# Patient Record
Sex: Female | Born: 1970 | Race: Black or African American | Hispanic: No | Marital: Married | Smoking: Former smoker
Health system: Southern US, Community
[De-identification: ages and names within clinical notes are randomized; demographics above are authoritative.]

## PROBLEM LIST (undated history)

## (undated) DIAGNOSIS — D259 Leiomyoma of uterus, unspecified: Secondary | ICD-10-CM

## (undated) DIAGNOSIS — D649 Anemia, unspecified: Secondary | ICD-10-CM

## (undated) DIAGNOSIS — T7840XA Allergy, unspecified, initial encounter: Secondary | ICD-10-CM

## (undated) DIAGNOSIS — R519 Headache, unspecified: Secondary | ICD-10-CM

## (undated) DIAGNOSIS — E119 Type 2 diabetes mellitus without complications: Secondary | ICD-10-CM

## (undated) DIAGNOSIS — K219 Gastro-esophageal reflux disease without esophagitis: Secondary | ICD-10-CM

## (undated) DIAGNOSIS — F32A Depression, unspecified: Secondary | ICD-10-CM

## (undated) HISTORY — DX: Allergy, unspecified, initial encounter: T78.40XA

## (undated) HISTORY — DX: Leiomyoma of uterus, unspecified: D25.9

## (undated) HISTORY — DX: Type 2 diabetes mellitus without complications: E11.9

## (undated) HISTORY — DX: Depression, unspecified: F32.A

## (undated) HISTORY — PX: ABDOMINAL HYSTERECTOMY: SHX81

---

## 2010-09-12 ENCOUNTER — Emergency Department: Payer: Self-pay | Admitting: Emergency Medicine

## 2010-11-11 ENCOUNTER — Ambulatory Visit: Payer: Self-pay | Admitting: Family Medicine

## 2011-05-23 DIAGNOSIS — G43909 Migraine, unspecified, not intractable, without status migrainosus: Secondary | ICD-10-CM | POA: Insufficient documentation

## 2012-11-02 ENCOUNTER — Emergency Department: Payer: Self-pay | Admitting: Emergency Medicine

## 2012-11-02 LAB — CBC
HGB: 13 g/dL (ref 12.0–16.0)
MCH: 26.5 pg (ref 26.0–34.0)
Platelet: 291 10*3/uL (ref 150–440)

## 2012-11-02 LAB — COMPREHENSIVE METABOLIC PANEL
Albumin: 3.5 g/dL (ref 3.4–5.0)
Alkaline Phosphatase: 79 U/L (ref 50–136)
Anion Gap: 3 — ABNORMAL LOW (ref 7–16)
BUN: 12 mg/dL (ref 7–18)
Bilirubin,Total: 0.1 mg/dL — ABNORMAL LOW (ref 0.2–1.0)
Chloride: 108 mmol/L — ABNORMAL HIGH (ref 98–107)
Creatinine: 0.94 mg/dL (ref 0.60–1.30)
Glucose: 80 mg/dL (ref 65–99)
Osmolality: 273 (ref 275–301)
SGOT(AST): 24 U/L (ref 15–37)
SGPT (ALT): 18 U/L (ref 12–78)
Total Protein: 7.9 g/dL (ref 6.4–8.2)

## 2012-11-02 LAB — URINALYSIS, COMPLETE
RBC,UR: 19157 /HPF (ref 0–5)
Squamous Epithelial: NONE SEEN

## 2012-11-02 LAB — LIPASE, BLOOD: Lipase: 205 U/L (ref 73–393)

## 2012-12-14 ENCOUNTER — Emergency Department: Payer: Self-pay | Admitting: Emergency Medicine

## 2012-12-14 LAB — COMPREHENSIVE METABOLIC PANEL
Albumin: 3.1 g/dL — ABNORMAL LOW (ref 3.4–5.0)
BUN: 10 mg/dL (ref 7–18)
Bilirubin,Total: 0.2 mg/dL (ref 0.2–1.0)
Co2: 25 mmol/L (ref 21–32)
Creatinine: 0.93 mg/dL (ref 0.60–1.30)
EGFR (African American): >60
Glucose: 109 mg/dL — ABNORMAL HIGH (ref 65–99)
Osmolality: 273 (ref 275–301)
Sodium: 137 mmol/L (ref 136–145)

## 2012-12-14 LAB — URINALYSIS, COMPLETE
Blood: NEGATIVE
Glucose,UR: NEGATIVE mg/dL (ref 0–75)
Nitrite: NEGATIVE
RBC,UR: 1 /HPF (ref 0–5)
Squamous Epithelial: 2
WBC UR: <1 /HPF (ref 0–5)

## 2012-12-14 LAB — CBC
HCT: 38.7 % (ref 35.0–47.0)
MCV: 79 fL — ABNORMAL LOW (ref 80–100)

## 2013-09-05 ENCOUNTER — Emergency Department: Payer: Self-pay | Admitting: Emergency Medicine

## 2013-09-05 LAB — BASIC METABOLIC PANEL
ANION GAP: 10 (ref 7–16)
BUN: 9 mg/dL (ref 7–18)
Calcium, Total: 8.8 mg/dL (ref 8.5–10.1)
Chloride: 105 mmol/L (ref 98–107)
Co2: 25 mmol/L (ref 21–32)
Creatinine: 1 mg/dL (ref 0.60–1.30)
EGFR (African American): >60
GLUCOSE: 115 mg/dL — AB (ref 65–99)
Osmolality: 279 (ref 275–301)
Potassium: 3.7 mmol/L (ref 3.5–5.1)
Sodium: 140 mmol/L (ref 136–145)

## 2013-09-05 LAB — CBC WITH DIFFERENTIAL/PLATELET
BASOS PCT: 0.8 %
Basophil #: 0.1 10*3/uL (ref 0.0–0.1)
EOS ABS: 0.2 10*3/uL (ref 0.0–0.7)
Eosinophil %: 2.1 %
HCT: 33.7 % — ABNORMAL LOW (ref 35.0–47.0)
HGB: 10.5 g/dL — AB (ref 12.0–16.0)
Lymphocyte #: 2.7 10*3/uL (ref 1.0–3.6)
Lymphocyte %: 29.5 %
MCH: 22.6 pg — ABNORMAL LOW (ref 26.0–34.0)
MCHC: 31 g/dL — AB (ref 32.0–36.0)
MCV: 73 fL — ABNORMAL LOW (ref 80–100)
Monocyte #: 0.7 x10 3/mm (ref 0.2–0.9)
Monocyte %: 8 %
NEUTROS PCT: 59.6 %
Neutrophil #: 5.5 10*3/uL (ref 1.4–6.5)
PLATELETS: 387 10*3/uL (ref 150–440)
RBC: 4.64 10*6/uL (ref 3.80–5.20)
RDW: 18.1 % — ABNORMAL HIGH (ref 11.5–14.5)
WBC: 9.3 10*3/uL (ref 3.6–11.0)

## 2013-09-05 LAB — TROPONIN I

## 2014-03-10 ENCOUNTER — Emergency Department: Payer: Self-pay | Admitting: Emergency Medicine

## 2014-03-12 LAB — BETA STREP CULTURE(ARMC)

## 2014-05-28 ENCOUNTER — Emergency Department
Admit: 2014-05-28 | Disposition: A | Discharge status: Home / Self Care | Payer: Self-pay | Admitting: Emergency Medicine

## 2014-05-28 DIAGNOSIS — F329 Major depressive disorder, single episode, unspecified: Secondary | ICD-10-CM | POA: Insufficient documentation

## 2014-05-28 LAB — BASIC METABOLIC PANEL
Anion Gap: 5 — ABNORMAL LOW (ref 7–16)
BUN: 10 mg/dL
CALCIUM: 8.7 mg/dL — AB
CREATININE: 0.8 mg/dL
Chloride: 107 mmol/L
Co2: 25 mmol/L
EGFR (African American): >60
EGFR (Non-African Amer.): >60
GLUCOSE: 121 mg/dL — AB
Potassium: 3.7 mmol/L
Sodium: 137 mmol/L

## 2014-05-28 LAB — TROPONIN I: Troponin-I: <0.03 ng/mL

## 2014-05-29 LAB — CBC WITH DIFFERENTIAL/PLATELET
BASOS ABS: 0.1 10*3/uL (ref 0.0–0.1)
Basophil %: 1.1 %
EOS ABS: 0.3 10*3/uL (ref 0.0–0.7)
Eosinophil %: 3.7 %
HCT: 27.1 % — ABNORMAL LOW (ref 35.0–47.0)
HGB: 8 g/dL — ABNORMAL LOW (ref 12.0–16.0)
LYMPHS ABS: 2.5 10*3/uL (ref 1.0–3.6)
Lymphocyte %: 31.9 %
MCH: 18.6 pg — AB (ref 26.0–34.0)
MCHC: 29.7 g/dL — ABNORMAL LOW (ref 32.0–36.0)
MCV: 63 fL — ABNORMAL LOW (ref 80–100)
MONO ABS: 0.6 x10 3/mm (ref 0.2–0.9)
Monocyte %: 7.1 %
NEUTROS ABS: 4.5 10*3/uL (ref 1.4–6.5)
Neutrophil %: 56.2 %
Platelet: 327 10*3/uL (ref 150–440)
RBC: 4.33 10*6/uL (ref 3.80–5.20)
RDW: 20 % — AB (ref 11.5–14.5)
WBC: 8 10*3/uL (ref 3.6–11.0)

## 2015-11-06 DIAGNOSIS — O200 Threatened abortion: Secondary | ICD-10-CM | POA: Insufficient documentation

## 2015-11-06 DIAGNOSIS — R112 Nausea with vomiting, unspecified: Secondary | ICD-10-CM | POA: Insufficient documentation

## 2015-11-06 DIAGNOSIS — R102 Pelvic and perineal pain: Secondary | ICD-10-CM | POA: Insufficient documentation

## 2015-11-06 DIAGNOSIS — Z87891 Personal history of nicotine dependence: Secondary | ICD-10-CM | POA: Insufficient documentation

## 2015-11-06 DIAGNOSIS — Z3A00 Weeks of gestation of pregnancy not specified: Secondary | ICD-10-CM | POA: Insufficient documentation

## 2015-11-06 LAB — POCT PREGNANCY, URINE: PREG TEST UR: POSITIVE — AB

## 2015-11-06 NOTE — ED Triage Notes (Signed)
Pt reports to ED w/ lower abd cramping that began today.  Pt denies urinary s/s, last BM today.  Denies CP. SOB, n/v/d, LOC, fever or dizziness.  Pt sts that she had positive preg test at home.

## 2015-11-07 ENCOUNTER — Emergency Department
Admission: EM | Admit: 2015-11-07 | Discharge: 2015-11-07 | Disposition: A | Discharge status: Home / Self Care | Payer: Self-pay | Attending: Emergency Medicine | Admitting: Emergency Medicine

## 2015-11-07 ENCOUNTER — Emergency Department: Payer: Self-pay

## 2015-11-07 DIAGNOSIS — O200 Threatened abortion: Secondary | ICD-10-CM

## 2015-11-07 HISTORY — DX: Anemia, unspecified: D64.9

## 2015-11-07 LAB — COMPREHENSIVE METABOLIC PANEL
ALT: 14 U/L (ref 14–54)
ANION GAP: 7 (ref 5–15)
AST: 15 U/L (ref 15–41)
Albumin: 4 g/dL (ref 3.5–5.0)
Alkaline Phosphatase: 58 U/L (ref 38–126)
BUN: 8 mg/dL (ref 6–20)
CALCIUM: 9.2 mg/dL (ref 8.9–10.3)
CO2: 24 mmol/L (ref 22–32)
Chloride: 106 mmol/L (ref 101–111)
Creatinine, Ser: 0.77 mg/dL (ref 0.44–1.00)
GFR calc Af Amer: >60 mL/min (ref >60)
Glucose, Bld: 113 mg/dL — ABNORMAL HIGH (ref 65–99)
POTASSIUM: 3.6 mmol/L (ref 3.5–5.1)
Sodium: 137 mmol/L (ref 135–145)
TOTAL PROTEIN: 7.8 g/dL (ref 6.5–8.1)
Total Bilirubin: 0.3 mg/dL (ref 0.3–1.2)

## 2015-11-07 LAB — CBC
HCT: 38.6 % (ref 35.0–47.0)
Hemoglobin: 12.4 g/dL (ref 12.0–16.0)
MCH: 24.7 pg — ABNORMAL LOW (ref 26.0–34.0)
MCHC: 32.2 g/dL (ref 32.0–36.0)
MCV: 76.7 fL — AB (ref 80.0–100.0)
PLATELETS: 270 10*3/uL (ref 150–440)
RBC: 5.04 MIL/uL (ref 3.80–5.20)
RDW: 22.1 % — AB (ref 11.5–14.5)
WBC: 10.6 10*3/uL (ref 3.6–11.0)

## 2015-11-07 LAB — URINALYSIS COMPLETE WITH MICROSCOPIC (ARMC ONLY)
BILIRUBIN URINE: NEGATIVE
Bacteria, UA: NONE SEEN
GLUCOSE, UA: NEGATIVE mg/dL
HGB URINE DIPSTICK: NEGATIVE
Ketones, ur: NEGATIVE mg/dL
LEUKOCYTES UA: NEGATIVE
Nitrite: NEGATIVE
Protein, ur: NEGATIVE mg/dL
RBC / HPF: NONE SEEN RBC/hpf (ref 0–5)
Specific Gravity, Urine: 1.01 (ref 1.005–1.030)
pH: 6 (ref 5.0–8.0)

## 2015-11-07 LAB — LIPASE, BLOOD: Lipase: 29 U/L (ref 11–51)

## 2015-11-07 LAB — WET PREP, GENITAL
SPERM: NONE SEEN
TRICH WET PREP: NONE SEEN
YEAST WET PREP: NONE SEEN

## 2015-11-07 LAB — HCG, QUANTITATIVE, PREGNANCY: hCG, Beta Chain, Quant, S: 201 m[IU]/mL — ABNORMAL HIGH (ref <5)

## 2015-11-07 LAB — CHLAMYDIA/NGC RT PCR (ARMC ONLY)
CHLAMYDIA TR: NOT DETECTED
N gonorrhoeae: NOT DETECTED

## 2015-11-07 LAB — ABO/RH: ABO/RH(D): A POS

## 2015-11-07 MED ORDER — ONDANSETRON 4 MG PO TBDP
4.0000 mg | ORAL_TABLET | Freq: Once | ORAL | Status: AC
  Administered 2015-11-07: 4 mg via ORAL
  Filled 2015-11-07: qty 1

## 2015-11-07 MED ORDER — HYDROCODONE-ACETAMINOPHEN 5-325 MG PO TABS
1.0000 | ORAL_TABLET | Freq: Once | ORAL | Status: AC
  Administered 2015-11-07: 1 via ORAL
  Filled 2015-11-07: qty 1

## 2015-11-07 NOTE — ED Notes (Signed)
Pt c/o lower abdominal pain that began this morning. Pt denying any spotting. Pt stating this is her 5th pregnancy. Pt has not had any discharge that she has noted.  Dr. Beather Arbour at bedside. Pelvic exam conducted. Wet prep and Gen/Prob collected. Denies mild nausea with no emesis. Last sexual intercourse was greater than 48 hours ago.

## 2015-11-07 NOTE — ED Notes (Signed)
Patient transported to Ultrasound 

## 2015-11-07 NOTE — ED Notes (Signed)
Patient transported from Ultrasound 

## 2015-11-07 NOTE — Discharge Instructions (Signed)
1. Return to outpatient lab on 9/11 for repeat blood work. 2. Do not use tampons, douche or have sexual intercourse until seen by the [facility] doctor. 3. Return to the ER for worsening symptoms, soaking more than 1 pad per hour, fainting, difficulty breathing or other concerns.

## 2015-11-07 NOTE — ED Provider Notes (Signed)
[facility] Emergency Department Provider Note   ____________________________________________   First MD Initiated Contact with Patient 11/07/15 0102     (approximate)  I have reviewed the triage vital signs and the nursing notes.   HISTORY  Chief Complaint Abdominal Pain    HPI Courtney Cruz is a 45 y.o. female who presents to the ED from home with a chief complaint of pelvic cramping. Symptoms onset today. Patient had a positive pregnancy test at home. She is a G5 P3 Ab1; clear or sensory.. Last sexual intercourse several days ago. Reports light vaginal spotting 2 days ago but none since. Notes mild nausea at times. Denies fever, chills, chest pain, shortness of breath,vomiting, dysuria, diarrhea. Denies recent travel trauma. Nothing makes her symptoms better or worse.   Past Medical History:  Diagnosis Date  . Anemia     There are no active problems to display for this patient.   Past Surgical History:  Procedure Laterality Date  . CESAREAN SECTION      Prior to Admission medications   Not on File    Allergies Imitrex [sumatriptan] and Latex  No family history on file.  Social History Social History  Substance Use Topics  . Smoking status: Former Research scientist (life sciences)  . Smokeless tobacco: Never Used  . Alcohol use No    Review of Systems  Constitutional: No fever/chills. Eyes: No visual changes. ENT: No sore throat. Cardiovascular: Denies chest pain. Respiratory: Denies shortness of breath. Gastrointestinal: Positive for pelvic cramping. No abdominal pain.  Positive for nausea nausea, no vomiting.  No diarrhea.  No constipation. Genitourinary: Negative for dysuria. Musculoskeletal: Negative for back pain. Skin: Negative for rash. Neurological: Negative for headaches, focal weakness or numbness.  10-point ROS otherwise negative.  ____________________________________________   PHYSICAL EXAM:  VITAL SIGNS: ED Triage Vitals  [11/06/15 2343]  Enc Vitals Group     BP      Pulse      Resp      Temp      Temp src      SpO2      Weight      Height      Head Circumference      Peak Flow      Pain Score 8     Pain Loc      Pain Edu?      Excl. in [place]?     Constitutional: Alert and oriented. Well appearing and in no acute distress. Eyes: Conjunctivae are normal. PERRL. EOMI. Head: Atraumatic. Nose: No congestion/rhinnorhea. Mouth/Throat: Mucous membranes are moist.  Oropharynx non-erythematous. Neck: No stridor.   Cardiovascular: Normal rate, regular rhythm. Grossly normal heart sounds.  Good peripheral circulation. Respiratory: Normal respiratory effort.  No retractions. Lungs CTAB. Gastrointestinal: Soft and mildly tender to palpation suprapubic area without rebound or guarding. No distention. No abdominal bruits. No CVA tenderness. Musculoskeletal: No lower extremity tenderness nor edema.  No joint effusions. Neurologic:  Normal speech and language. No gross focal neurologic deficits are appreciated. No gait instability. Skin:  Skin is warm, dry and intact. No rash noted. Psychiatric: Mood and affect are normal. Speech and behavior are normal.  ____________________________________________   LABS (all labs ordered are listed, but only abnormal results are displayed)  Labs Reviewed  WET PREP, GENITAL - Abnormal; Notable for the following:       Result Value   Clue Cells Wet Prep HPF POC PRESENT (*)    WBC, Wet Prep HPF POC FEW (*)  All other components within normal limits  COMPREHENSIVE METABOLIC PANEL - Abnormal; Notable for the following:    Glucose, Bld 113 (*)    All other components within normal limits  CBC - Abnormal; Notable for the following:    MCV 76.7 (*)    MCH 24.7 (*)    RDW 22.1 (*)    All other components within normal limits  URINALYSIS COMPLETEWITH MICROSCOPIC ([facility] ONLY) - Abnormal; Notable for the following:    Color, Urine YELLOW (*)    APPearance CLEAR (*)     Squamous Epithelial / LPF 0-5 (*)    All other components within normal limits  HCG, QUANTITATIVE, PREGNANCY - Abnormal; Notable for the following:    hCG, Beta Chain, Quant, S 201 (*)    All other components within normal limits  POCT PREGNANCY, URINE - Abnormal; Notable for the following:    Preg Test, Ur POSITIVE (*)    All other components within normal limits  CHLAMYDIA/NGC RT PCR ([facility] ONLY)  LIPASE, BLOOD  ABO/RH   ____________________________________________  EKG  None ____________________________________________  RADIOLOGY  OB ultrasound interpreted per Dr. Quintella Reichert: No intrauterine pregnancy. Please note with positive HCG levels  possibility of an ectopic pregnancy is not entirely excluded.  Correlation with clinical exam and follow-up with serial HCG levels  and ultrasound recommended.    Distended cervix with small-bowel echogenic content likely blood  products. Correlation with clinical exam recommended. No definite  distention of the endometrial canal identified.   ____________________________________________   PROCEDURES  Procedure(s) performed:   Pelvic exam: External exam WNL without rashes, lesions or vesicles. Speculum exam reveals mildly bloodly discharge. Cervical os closed. Bimanual exam WNL.  Procedures  Critical Care performed: No  ____________________________________________   INITIAL IMPRESSION / ASSESSMENT AND PLAN / ED COURSE  Pertinent labs & imaging results that were available during my care of the patient were reviewed by me and considered in my medical decision making (see chart for details).  45 year old female who presents with pelvic cramping and positive home pregnancy test. Positive UPT here; will add beta hCG. Will add group type and Rh given bloody discharge. Will send for ultrasound.  Clinical Course  Comment By Time  Updated patient of ultrasound results. She will come to outpatient laboratory for repeat beta  hCG in 48 hours and follow up closely with OB/GYN. Noted the presence of clue cells on patient's wet prep. Oral Flagyl is not proven safe for first trimester pregnancy, and patient is experience vaginal bleeding, so I do not want to prescribe MetroGel. She can be started on Flagyl at her later date, either in second trimester pregnancy or earlier if she miscarries. Strict return precautions given. Patient verbalizes understanding and agrees with plan of care. Paulette Blanch, MD 09/09 0400     ____________________________________________   FINAL CLINICAL IMPRESSION(S) / ED DIAGNOSES  Final diagnoses:  Threatened miscarriage in early pregnancy      NEW MEDICATIONS STARTED DURING THIS VISIT:  There are no discharge medications for this patient.    Note:  This document was prepared using Dragon voice recognition software and may include unintentional dictation errors.    Paulette Blanch, MD 11/07/15 [phone]

## 2016-07-27 ENCOUNTER — Encounter: Payer: Self-pay | Admitting: Emergency Medicine

## 2016-07-27 ENCOUNTER — Emergency Department
Admission: EM | Admit: 2016-07-27 | Discharge: 2016-07-27 | Disposition: A | Discharge status: Home / Self Care | Payer: Self-pay | Attending: Emergency Medicine | Admitting: Emergency Medicine

## 2016-07-27 DIAGNOSIS — J0100 Acute maxillary sinusitis, unspecified: Secondary | ICD-10-CM

## 2016-07-27 DIAGNOSIS — Z87891 Personal history of nicotine dependence: Secondary | ICD-10-CM | POA: Insufficient documentation

## 2016-07-27 MED ORDER — FEXOFENADINE-PSEUDOEPHED ER 60-120 MG PO TB12: 1.0000 | ORAL_TABLET | Freq: Two times a day (BID) | ORAL | 0 refills | Status: DC

## 2016-07-27 MED ORDER — AMOXICILLIN 500 MG PO CAPS: 500.0000 mg | ORAL_CAPSULE | Freq: Three times a day (TID) | ORAL | 0 refills | Status: DC

## 2016-07-27 MED ORDER — TRAMADOL HCL 50 MG PO TABS
50.0000 mg | ORAL_TABLET | Freq: Once | ORAL | Status: AC
  Administered 2016-07-27: 50 mg via ORAL
  Filled 2016-07-27: qty 1

## 2016-07-27 MED ORDER — NAPROXEN 500 MG PO TABS: 500.0000 mg | ORAL_TABLET | Freq: Two times a day (BID) | ORAL | Status: DC

## 2016-07-27 MED ORDER — IBUPROFEN 600 MG PO TABS
600.0000 mg | ORAL_TABLET | Freq: Once | ORAL | Status: AC
  Administered 2016-07-27: 600 mg via ORAL
  Filled 2016-07-27: qty 1

## 2016-07-27 NOTE — ED Notes (Signed)
See triage note  States she developed some sinus pressure and discomfort on Friday  Has tried OTC meds w/ o relief min swelling to right side of face   Afebrile on arrival

## 2016-07-27 NOTE — ED Triage Notes (Signed)
Sinus congestion and pressure since Friday-- pain to right cheek.  Tried some OTC sinus medicine which helped to relieve symptoms somewhat, but pain and pressure persist.

## 2016-07-27 NOTE — ED Provider Notes (Signed)
[facility] Emergency Department Provider Note   ____________________________________________   First MD Initiated Contact with Patient 07/27/16 1114     (approximate)  I have reviewed the triage vital signs and the nursing notes.   HISTORY  Chief Complaint Facial Pain    HPI Courtney Cruz is a 46 y.o. female patient complaining of 5 days of right facial pain. Patient also states sinus congestion. Patient reported ear pressure and sore throat secondary to postnasal drainage. Patient stated transit relief with over-the-counter sinus medications.patient denies fever with this complaint. Patient denies nausea, vomiting, or diarrhea. Patient rates the pain as a 7/10. Patient described a pain as "facial pressure.   Past Medical History:  Diagnosis Date  . Anemia     There are no active problems to display for this patient.   Past Surgical History:  Procedure Laterality Date  . CESAREAN SECTION      Prior to Admission medications   Medication Sig Start Date End Date Taking? Authorizing Provider  amoxicillin (AMOXIL) 500 MG capsule Take 1 capsule (500 mg total) by mouth 3 (three) times daily. 07/27/16   Sable Feil, PA-C  fexofenadine-pseudoephedrine (ALLEGRA-D) 60-120 MG 12 hr tablet Take 1 tablet by mouth 2 (two) times daily. 07/27/16   Sable Feil, PA-C  naproxen (NAPROSYN) 500 MG tablet Take 1 tablet (500 mg total) by mouth 2 (two) times daily with a meal. 07/27/16   Sable Feil, PA-C    Allergies Imitrex [sumatriptan] and Latex  No family history on file.  Social History Social History  Substance Use Topics  . Smoking status: Former Research scientist (life sciences)  . Smokeless tobacco: Never Used  . Alcohol use No    Review of Systems  Constitutional: No fever/chills Eyes: No visual changes. ENT: No sore throat. Nasal congestion Cardiovascular: Denies chest pain. Respiratory: Denies shortness of breath. Gastrointestinal: No abdominal pain.  No  nausea, no vomiting.  No diarrhea.  No constipation. Genitourinary: Negative for dysuria. Musculoskeletal: Negative for back pain. Skin: Negative for rash. Neurological: Negative for headaches, focal weakness or numbness. Allergic/Immunilogical:Imitrex ____________________________________________   PHYSICAL EXAM:  VITAL SIGNS: ED Triage Vitals  Enc Vitals Group     BP 07/27/16 1106 136/75     Pulse Rate 07/27/16 1106 75     Resp 07/27/16 1106 16     Temp 07/27/16 1106 98.8 F (37.1 C)     Temp Source 07/27/16 1106 Oral     SpO2 07/27/16 1106 99 %     Weight 07/27/16 1104 183 lb (83 kg)     Height 07/27/16 1104 4\' 11"  (1.499 m)     Head Circumference --      Peak Flow --      Pain Score 07/27/16 1104 7     Pain Loc --      Pain Edu? --      Excl. in [place]? --     Constitutional: Alert and oriented. Well appearing and in no acute distress. Eyes: Conjunctivae are normal. PERRL. EOMI. Head: Atraumatic. Nose: bilateral maxillary guarding. There was nasal turbinates. Thick rhinorrhea. Mouth/Throat: Mucous membranes are moist.  Oropharynx non-erythematous. Postnasal drainage Neck: No stridor. No cervical spine tenderness to palpation. Cardiovascular: Normal rate, regular rhythm. Grossly normal heart sounds.  Good peripheral circulation. Respiratory: Normal respiratory effort.  No retractions. Lungs CTAB. Neurologic:  Normal speech and language. No gross focal neurologic deficits are appreciated. No gait instability. Skin:  Skin is warm, dry and intact. No rash noted.  Psychiatric: Mood and affect are normal. Speech and behavior are normal.  ____________________________________________   LABS (all labs ordered are listed, but only abnormal results are displayed)  Labs Reviewed - No data to  display ____________________________________________  EKG   ____________________________________________  RADIOLOGY   ____________________________________________   PROCEDURES  Procedure(s) performed: None  Procedures  Critical Care performed: No  ____________________________________________   INITIAL IMPRESSION / ASSESSMENT AND PLAN / ED COURSE  Pertinent labs & imaging results that were available during my care of the patient were reviewed by me and considered in my medical decision making (see chart for details).  Sinusitis. Patient given discharge care instructions. Patient [facility].      ____________________________________________   FINAL CLINICAL IMPRESSION(S) / ED DIAGNOSES  Final diagnoses:  Subacute maxillary sinusitis      NEW MEDICATIONS STARTED DURING THIS VISIT:  New Prescriptions   AMOXICILLIN (AMOXIL) 500 MG CAPSULE    Take 1 capsule (500 mg total) by mouth 3 (three) times daily.   FEXOFENADINE-PSEUDOEPHEDRINE (ALLEGRA-D) 60-120 MG 12 HR TABLET    Take 1 tablet by mouth 2 (two) times daily.   NAPROXEN (NAPROSYN) 500 MG TABLET    Take 1 tablet (500 mg total) by mouth 2 (two) times daily with a meal.     Note:  This document was prepared using Dragon voice recognition software and may include unintentional dictation errors.    Sable Feil, PA-C 07/27/16 1123    Earleen Newport, MD 07/27/16 [phone]

## 2016-12-02 ENCOUNTER — Other Ambulatory Visit (INDEPENDENT_AMBULATORY_CARE_PROVIDER_SITE_OTHER): Payer: Self-pay | Admitting: *Deleted

## 2017-08-22 ENCOUNTER — Other Ambulatory Visit: Payer: Self-pay

## 2017-08-22 ENCOUNTER — Emergency Department
Admission: EM | Admit: 2017-08-22 | Discharge: 2017-08-22 | Disposition: A | Discharge status: Home / Self Care | Payer: Self-pay | Attending: Emergency Medicine | Admitting: Emergency Medicine

## 2017-08-22 ENCOUNTER — Emergency Department: Payer: Self-pay

## 2017-08-22 DIAGNOSIS — R110 Nausea: Secondary | ICD-10-CM | POA: Insufficient documentation

## 2017-08-22 DIAGNOSIS — N390 Urinary tract infection, site not specified: Secondary | ICD-10-CM | POA: Insufficient documentation

## 2017-08-22 DIAGNOSIS — R42 Dizziness and giddiness: Secondary | ICD-10-CM

## 2017-08-22 DIAGNOSIS — Z87891 Personal history of nicotine dependence: Secondary | ICD-10-CM | POA: Insufficient documentation

## 2017-08-22 LAB — GLUCOSE, CAPILLARY: GLUCOSE-CAPILLARY: 102 mg/dL — AB (ref 70–99)

## 2017-08-22 LAB — URINALYSIS, COMPLETE (UACMP) WITH MICROSCOPIC
BILIRUBIN URINE: NEGATIVE
Bacteria, UA: NONE SEEN
Glucose, UA: NEGATIVE mg/dL
KETONES UR: NEGATIVE mg/dL
LEUKOCYTES UA: NEGATIVE
NITRITE: NEGATIVE
PH: 5 (ref 5.0–8.0)
Protein, ur: 30 mg/dL — AB
SPECIFIC GRAVITY, URINE: 1.023 (ref 1.005–1.030)

## 2017-08-22 LAB — BASIC METABOLIC PANEL
Anion gap: 7 (ref 5–15)
BUN: 10 mg/dL (ref 6–20)
CHLORIDE: 108 mmol/L (ref 98–111)
CO2: 22 mmol/L (ref 22–32)
CREATININE: 0.77 mg/dL (ref 0.44–1.00)
Calcium: 8.7 mg/dL — ABNORMAL LOW (ref 8.9–10.3)
GFR calc non Af Amer: >60 mL/min (ref >60)
Glucose, Bld: 112 mg/dL — ABNORMAL HIGH (ref 70–99)
POTASSIUM: 3.5 mmol/L (ref 3.5–5.1)
SODIUM: 137 mmol/L (ref 135–145)

## 2017-08-22 LAB — CBC
HEMATOCRIT: 30.1 % — AB (ref 35.0–47.0)
Hemoglobin: 9.2 g/dL — ABNORMAL LOW (ref 12.0–16.0)
MCH: 19.4 pg — ABNORMAL LOW (ref 26.0–34.0)
MCHC: 30.6 g/dL — ABNORMAL LOW (ref 32.0–36.0)
MCV: 63.5 fL — AB (ref 80.0–100.0)
PLATELETS: 340 10*3/uL (ref 150–440)
RBC: 4.73 MIL/uL (ref 3.80–5.20)
RDW: 21.1 % — AB (ref 11.5–14.5)
WBC: 6.1 10*3/uL (ref 3.6–11.0)

## 2017-08-22 LAB — POCT PREGNANCY, URINE: Preg Test, Ur: NEGATIVE

## 2017-08-22 MED ORDER — MECLIZINE HCL 25 MG PO TABS: 25.0000 mg | ORAL_TABLET | Freq: Three times a day (TID) | ORAL | 0 refills | Status: DC | PRN

## 2017-08-22 MED ORDER — ONDANSETRON 4 MG PO TBDP: 4.0000 mg | ORAL_TABLET | Freq: Three times a day (TID) | ORAL | 0 refills | Status: DC | PRN

## 2017-08-22 MED ORDER — CEPHALEXIN 500 MG PO CAPS: 500.0000 mg | ORAL_CAPSULE | Freq: Three times a day (TID) | ORAL | 0 refills | Status: AC

## 2017-08-22 MED ORDER — ONDANSETRON 4 MG PO TBDP
4.0000 mg | ORAL_TABLET | Freq: Once | ORAL | Status: AC
  Administered 2017-08-22: 4 mg via ORAL
  Filled 2017-08-22: qty 1

## 2017-08-22 MED ORDER — DIAZEPAM 2 MG PO TABS: 2.0000 mg | ORAL_TABLET | Freq: Two times a day (BID) | ORAL | 0 refills | Status: AC | PRN

## 2017-08-22 MED ORDER — MECLIZINE HCL 25 MG PO TABS
25.0000 mg | ORAL_TABLET | Freq: Once | ORAL | Status: AC
  Administered 2017-08-22: 25 mg via ORAL
  Filled 2017-08-22: qty 1

## 2017-08-22 MED ORDER — DIAZEPAM 2 MG PO TABS
2.0000 mg | ORAL_TABLET | Freq: Once | ORAL | Status: AC
  Administered 2017-08-22: 2 mg via ORAL
  Filled 2017-08-22: qty 1

## 2017-08-22 NOTE — ED Notes (Signed)
Patient transported to MRI 

## 2017-08-22 NOTE — ED Notes (Signed)
Pt verbalizes understanding of d/c instructions, medications and follow up 

## 2017-08-22 NOTE — Discharge Instructions (Signed)
Use the Zofran melt on your tongue wafers 3 times a day as needed for nausea.  Take the Antivert 1 3 times a day to help with the spinning or vertigo.  Valium twice a day for the same reason.  Please return if you are worse.  Follow-up with your doctor or see your nose and throat Dr. Tami Ribas if you are not any better in a week.  If you cannot do that you can return here as well.  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  It would not be safe to be holding a child if you are having this spinning sensation. Use the Keflex 1 pill 3 times a day for the UTI.

## 2017-08-22 NOTE — ED Triage Notes (Signed)
Pt states that she feels like the room is spinning since yesterday. Dizziness with standing. Nausea. Pt states that dizziness worsens and hot flash after urinating. Pt alert and oriented X4, active, cooperative, pt in NAD. RR even and unlabored, color WNL.

## 2017-08-22 NOTE — ED Provider Notes (Addendum)
[facility] Emergency Department Provider Note   ____________________________________________   First MD Initiated Contact with Patient 08/22/17 1125     (approximate)  I have reviewed the triage vital signs and the nursing notes.   HISTORY  Chief Complaint Dizziness and Nausea   HPI Courtney Cruz is a 47 y.o. female who reports onset of vertigo with nausea after watching TV last night.  Gets worse if she stands.  Room is spinning.  She did work vomit this morning.  She has no other neurological findings numbness weakness slurry speech or anything else.  He is never had this before.   Past Medical History:  Diagnosis Date  . Anemia     There are no active problems to display for this patient.   Past Surgical History:  Procedure Laterality Date  . CESAREAN SECTION      Prior to Admission medications   Medication Sig Start Date End Date Taking? Authorizing Provider  amoxicillin (AMOXIL) 500 MG capsule Take 1 capsule (500 mg total) by mouth 3 (three) times daily. Patient not taking: Reported on 08/22/2017 07/27/16   Sable Feil, PA-C  cephALEXin (KEFLEX) 500 MG capsule Take 1 capsule (500 mg total) by mouth 3 (three) times daily for 10 days. 08/22/17 09/01/17  Nena Polio, MD  diazepam (VALIUM) 2 MG tablet Take 1 tablet (2 mg total) by mouth every 12 (twelve) hours as needed (vertigo). 08/22/17 08/22/18  Nena Polio, MD  fexofenadine-pseudoephedrine (ALLEGRA-D) 60-120 MG 12 hr tablet Take 1 tablet by mouth 2 (two) times daily. Patient not taking: Reported on 08/22/2017 07/27/16   Sable Feil, PA-C  meclizine (ANTIVERT) 25 MG tablet Take 1 tablet (25 mg total) by mouth 3 (three) times daily as needed for dizziness. 08/22/17   Nena Polio, MD  naproxen (NAPROSYN) 500 MG tablet Take 1 tablet (500 mg total) by mouth 2 (two) times daily with a meal. Patient not taking: Reported on 08/22/2017 07/27/16   Sable Feil, PA-C  ondansetron (ZOFRAN  ODT) 4 MG disintegrating tablet Take 1 tablet (4 mg total) by mouth every 8 (eight) hours as needed for nausea or vomiting. 08/22/17   Nena Polio, MD    Allergies Imitrex [sumatriptan] and Latex  No family history on file.  Social History Social History   Tobacco Use  . Smoking status: Former Research scientist (life sciences)  . Smokeless tobacco: Never Used  Substance Use Topics  . Alcohol use: No  . Drug use: Not on file    Review of Systems  Constitutional: No fever/chills Eyes: No visual changes. ENT: No sore throat. Cardiovascular: Denies chest pain. Respiratory: Denies shortness of breath. Gastrointestinal: No abdominal pain.   nausea,  vomiting.  No diarrhea.  No constipation. Genitourinary: Negative for dysuria. Musculoskeletal: Negative for back pain. Skin: Negative for rash. Neurological: Negative for headaches, focal weakness  ____________________________________________   PHYSICAL EXAM:  VITAL SIGNS: ED Triage Vitals  Enc Vitals Group     BP 08/22/17 0955 129/71     Pulse Rate 08/22/17 0953 72     Resp 08/22/17 0953 18     Temp 08/22/17 0953 98 F (36.7 C)     Temp Source 08/22/17 0953 Oral     SpO2 08/22/17 0953 100 %     Weight 08/22/17 0953 185 lb (83.9 kg)     Height 08/22/17 0953 4\' 11"  (1.499 m)     Head Circumference --      Peak Flow --  Pain Score 08/22/17 0953 0     Pain Loc --      Pain Edu? --      Excl. in [place]? --     Constitutional: Alert and oriented. Well appearing and in no acute distress. Eyes: Conjunctivae are normal. PERRL. EOMI. fundi are normal.  Patient does have nystagmus with a fast component to the right Head: Atraumatic. Nose: No congestion/rhinnorhea. Mouth/Throat: Mucous membranes are moist.  Oropharynx non-erythematous. Neck: No stridor.  Cardiovascular: Normal rate, regular rhythm. Grossly normal heart sounds.  Good peripheral circulation. Respiratory: Normal respiratory effort.  No retractions. Lungs CTAB. Gastrointestinal: Soft  and nontender. No distention. No abdominal bruits. No CVA tenderness. Musculoskeletal: No lower extremity tenderness nor edema.  No joint effusions. Neurologic:  Normal speech and language.  Urinary 2 through 12 are intact except for the nystagmus.  Nystagmus is horizontal.  She does not have any observable skew deviation.  Thrust testing shows deviation of the eyes consistent with peripheral lesion.  Cerebellar finger-nose rapid alternating movements and hands are normal motor strength is 5/5 throughout there is no numbness per patient report Skin:  Skin is warm, dry and intact. No rash noted. Psychiatric: Mood and affect are normal. Speech and behavior are normal.  ____________________________________________   LABS (all labs ordered are listed, but only abnormal results are displayed)  Labs Reviewed  BASIC METABOLIC PANEL - Abnormal; Notable for the following components:      Result Value   Glucose, Bld 112 (*)    Calcium 8.7 (*)    All other components within normal limits  CBC - Abnormal; Notable for the following components:   Hemoglobin 9.2 (*)    HCT 30.1 (*)    MCV 63.5 (*)    MCH 19.4 (*)    MCHC 30.6 (*)    RDW 21.1 (*)    All other components within normal limits  URINALYSIS, COMPLETE (UACMP) WITH MICROSCOPIC - Abnormal; Notable for the following components:   Color, Urine YELLOW (*)    APPearance CLOUDY (*)    Hgb urine dipstick LARGE (*)    Protein, ur 30 (*)    All other components within normal limits  GLUCOSE, CAPILLARY - Abnormal; Notable for the following components:   Glucose-Capillary 102 (*)    All other components within normal limits  POC URINE PREG, ED  POCT PREGNANCY, URINE   ____________________________________________  EKG  EKG read and interpreted by me shows normal sinus rhythm rate of 69 normal axis essentially normal EKG ____________________________________________  RADIOLOGY  ED MD interpretation: MRI of the head is negative per  radiology  Official radiology report(s): Mr Brain Wo Contrast  Result Date: 08/22/2017 CLINICAL DATA:  Dizziness with standing. EXAM: MRI HEAD WITHOUT CONTRAST TECHNIQUE: Multiplanar, multiecho pulse sequences of the brain and surrounding structures were obtained without intravenous contrast. COMPARISON:  Head CT 09/05/2013 FINDINGS: BRAIN: There is no acute infarct, acute hemorrhage or mass effect. The midline structures are normal. There are no old infarcts. The white matter signal is normal for the patient's age. The CSF spaces are normal for age, with no hydrocephalus. Susceptibility-sensitive sequences show no chronic microhemorrhage or superficial siderosis. VASCULAR: Major intracranial arterial and venous sinus flow voids are preserved. SKULL AND UPPER CERVICAL SPINE: The visualized skull base, calvarium, upper cervical spine and extracranial soft tissues are normal. SINUSES/ORBITS: No fluid levels or advanced mucosal thickening. No mastoid or middle ear effusion. The orbits are normal. IMPRESSION: Normal brain MRI. Electronically Signed   By: Lennette Bihari  [place] M.D.   On: 08/22/2017 13:58    ____________________________________________   PROCEDURES  Procedure(s) performed:  Procedures  Critical Care performed:   ____________________________________________   INITIAL IMPRESSION / ASSESSMENT AND PLAN / ED COURSE  Patient with an exam consistent with peripheral vertigo and MR negative.  We will treat her for peripheral vertigo and allow her to go home.  She already feels somewhat better after the medicines we gave her here.  She will follow-up with her doctor as needed.        ____________________________________________   FINAL CLINICAL IMPRESSION(S) / ED DIAGNOSES  Final diagnoses:  Lower urinary tract infectious disease  Vertigo     ED Discharge Orders        Ordered    meclizine (ANTIVERT) 25 MG tablet  3 times daily PRN     08/22/17 1409    diazepam (VALIUM) 2 MG  tablet  Every 12 hours PRN     08/22/17 1409    ondansetron (ZOFRAN ODT) 4 MG disintegrating tablet  Every 8 hours PRN     08/22/17 1409    cephALEXin (KEFLEX) 500 MG capsule  3 times daily     08/22/17 1412       Note:  This document was prepared using Dragon voice recognition software and may include unintentional dictation errors.    Nena Polio, MD 08/22/17 1805    Nena Polio, MD 09/04/17 [phone]

## 2018-12-19 ENCOUNTER — Other Ambulatory Visit: Payer: Self-pay | Admitting: *Deleted

## 2018-12-19 DIAGNOSIS — Z20822 Contact with and (suspected) exposure to covid-19: Secondary | ICD-10-CM

## 2018-12-20 LAB — NOVEL CORONAVIRUS, NAA: SARS-CoV-2, NAA: NOT DETECTED

## 2019-02-23 ENCOUNTER — Encounter: Payer: Self-pay | Admitting: Emergency Medicine

## 2019-02-23 ENCOUNTER — Other Ambulatory Visit: Payer: Self-pay

## 2019-02-23 DIAGNOSIS — Z87891 Personal history of nicotine dependence: Secondary | ICD-10-CM | POA: Diagnosis not present

## 2019-02-23 DIAGNOSIS — U071 COVID-19: Secondary | ICD-10-CM | POA: Diagnosis not present

## 2019-02-23 DIAGNOSIS — M791 Myalgia, unspecified site: Secondary | ICD-10-CM | POA: Diagnosis present

## 2019-02-23 NOTE — ED Triage Notes (Signed)
Chills aches and head congestion x 2 days.

## 2019-02-24 ENCOUNTER — Emergency Department
Admission: EM | Admit: 2019-02-24 | Discharge: 2019-02-24 | Disposition: A | Discharge status: Home / Self Care | Payer: HRSA Program | Attending: Emergency Medicine | Admitting: Emergency Medicine

## 2019-02-24 ENCOUNTER — Telehealth: Payer: Self-pay | Admitting: Emergency Medicine

## 2019-02-24 DIAGNOSIS — Z20822 Contact with and (suspected) exposure to covid-19: Secondary | ICD-10-CM

## 2019-02-24 DIAGNOSIS — B349 Viral infection, unspecified: Secondary | ICD-10-CM

## 2019-02-24 LAB — SARS CORONAVIRUS 2 (TAT 6-24 HRS): SARS Coronavirus 2: POSITIVE — AB

## 2019-02-24 LAB — INFLUENZA PANEL BY PCR (TYPE A & B)
Influenza A By PCR: NEGATIVE
Influenza B By PCR: NEGATIVE

## 2019-02-24 MED ORDER — ACETAMINOPHEN 500 MG PO TABS
1000.0000 mg | ORAL_TABLET | Freq: Once | ORAL | Status: AC
  Administered 2019-02-24: 1000 mg via ORAL
  Filled 2019-02-24: qty 2

## 2019-02-24 NOTE — ED Notes (Signed)
Pt denies N/V/D

## 2019-02-24 NOTE — Discharge Instructions (Signed)
QUARANTINE INSTRUCTION  Follow these instructions at home:  Protecting others To avoid spreading the illness to other people: Quarantine in your home until you have had no cough and fever for 7 days. Household members should also be quarantine for at least 14 days after being exposed to you. Wash your hands often with soap and water. If soap and water are not available, use an alcohol-based hand sanitizer. If you have not cleaned your hands, do not touch your face. Make sure that all people in your household wash their hands well and often. Cover your nose and mouth when you cough or sneeze. Throw away used tissues. Stay home if you have any cold-like or flu-like symptoms. General instructions Go to your local pharmacy and buy a pulse oximeter (this is a machine that measures your oxygen). Check your oxygen levels at least 3 times a day. If your oxygen level is 92% or less return to the emergency room immediately Take over-the-counter and prescription medicines only as told by your health care provider. If you need medication for fever take tylenol or ibuprofen Drink enough fluid to keep your urine pale yellow. Rest at home as directed by your health care provider. Do not give aspirin to a child with the flu, because of the association with Reye's syndrome. Do not use tobacco products, including cigarettes, chewing tobacco, and e-cigarettes. If you need help quitting, ask your health care provider. Keep all follow-up visits as told by your health care provider. This is important. How is this prevented? Avoid areas where an outbreak has been reported. Avoid large groups of people. Keep a safe distance from people who are coughing and sneezing. Do not touch your face if you have not cleaned your hands. When you are around people who are sick or might be sick, wear a mask to protect yourself. Contact a health care provider if: You have symptoms of SARS (cough, fever, chest pain, shortness of  breath) that are not getting better at home. You have a fever. If you have difficulty breathing go to your local ER or call 911

## 2019-02-24 NOTE — ED Notes (Signed)
Patient given verbal and written discharge instructions. Patient verbalizes understanding. Signature pad not working at this time.

## 2019-02-24 NOTE — ED Provider Notes (Signed)
[facility] Emergency Department Provider Note  ____________________________________________  Time seen: Approximately 4:49 AM  I have reviewed the triage vital signs and the nursing notes.   HISTORY  Chief Complaint Chills, Generalized Body Aches, and Nasal Congestion   HPI Courtney Cruz is a 48 y.o. female with a history of iron deficiency anemia on iron supplementation who presents for evaluation of Covid-like symptoms.  Patient is complaining of body aches, chills, congestion, and a dry cough for the last 2 days.  No fevers, no sore throat, no chest pain or shortness of breath, no nausea, no vomiting, no diarrhea.   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  She denies knowing of any of the kids being sick.  Past Medical History:  Diagnosis Date  . Anemia     Past Surgical History:  Procedure Laterality Date  . CESAREAN SECTION      Prior to Admission medications   Medication Sig Start Date End Date Taking? Authorizing Provider  amoxicillin (AMOXIL) 500 MG capsule Take 1 capsule (500 mg total) by mouth 3 (three) times daily. Patient not taking: Reported on 08/22/2017 07/27/16   Sable Feil, PA-C  fexofenadine-pseudoephedrine (ALLEGRA-D) 60-120 MG 12 hr tablet Take 1 tablet by mouth 2 (two) times daily. Patient not taking: Reported on 08/22/2017 07/27/16   Sable Feil, PA-C  meclizine (ANTIVERT) 25 MG tablet Take 1 tablet (25 mg total) by mouth 3 (three) times daily as needed for dizziness. 08/22/17   Nena Polio, MD  naproxen (NAPROSYN) 500 MG tablet Take 1 tablet (500 mg total) by mouth 2 (two) times daily with a meal. Patient not taking: Reported on 08/22/2017 07/27/16   Sable Feil, PA-C  ondansetron (ZOFRAN ODT) 4 MG disintegrating tablet Take 1 tablet (4 mg total) by mouth every 8 (eight) hours as needed for nausea or vomiting. 08/22/17   Nena Polio, MD     Allergies Imitrex [sumatriptan] and Latex  No family history on file.  Social History Social History   Tobacco Use  . Smoking status: Former Research scientist (life sciences)  . Smokeless tobacco: Never Used  Substance Use Topics  . Alcohol use: No  . Drug use: Not on file    Review of Systems  Constitutional: Negative for fever. + body aches and chills Eyes: Negative for visual changes. ENT: Negative for sore throat. + congestion Neck: No neck pain  Cardiovascular: Negative for chest pain. Respiratory: Negative for shortness of breath. + cough Gastrointestinal: Negative for abdominal pain, vomiting or diarrhea. Genitourinary: Negative for dysuria. Musculoskeletal: Negative for back pain. Skin: Negative for rash. Neurological: Negative for weakness or numbness. + HA Psych: No SI or HI  ____________________________________________   PHYSICAL EXAM:  VITAL SIGNS: ED Triage Vitals  Enc Vitals Group     BP 02/23/19 2127 138/70     Pulse Rate 02/23/19 2127 84     Resp 02/23/19 2127 20     Temp 02/23/19 2127 99.5 F (37.5 C)     Temp Source 02/23/19 2127 Oral     SpO2 02/23/19 2127 99 %     Weight 02/23/19 2126 200 lb (90.7 kg)     Height 02/23/19 2126 4\' 11"  (1.499 m)     Head Circumference --      Peak Flow --      Pain Score 02/23/19 2131 8     Pain Loc --  Pain Edu? --      Excl. in [place]? --     Constitutional: Alert and oriented. Well appearing and in no apparent distress. HEENT:      Head: Normocephalic and atraumatic.         Eyes: Conjunctivae are normal. Sclera is non-icteric.       Mouth/Throat: Mucous membranes are moist.       Neck: Supple with no signs of meningismus. Cardiovascular: Regular rate and rhythm. No murmurs, gallops, or rubs. 2+ symmetrical distal pulses are present in all extremities. No JVD. Respiratory: Normal respiratory effort. Lungs are clear to auscultation bilaterally. No wheezes, crackles, or rhonchi.  Gastrointestinal: Soft, non tender, and non  distended with positive bowel sounds. No rebound or guarding. Musculoskeletal: Nontender with normal range of motion in all extremities. No edema, cyanosis, or erythema of extremities. Neurologic: Normal speech and language. Face is symmetric. Moving all extremities. No gross focal neurologic deficits are appreciated. Skin: Skin is warm, dry and intact. No rash noted. Psychiatric: Mood and affect are normal. Speech and behavior are normal.  ____________________________________________   LABS (all labs ordered are listed, but only abnormal results are displayed)  Labs Reviewed  SARS CORONAVIRUS 2 (TAT 6-24 HRS)  INFLUENZA PANEL BY PCR (TYPE A & B)   ____________________________________________  EKG  none  ____________________________________________  RADIOLOGY  none  ____________________________________________   PROCEDURES  Procedure(s) performed: None Procedures Critical Care performed:  None ____________________________________________   INITIAL IMPRESSION / ASSESSMENT AND PLAN / ED COURSE   48 y.o. female with a history of iron deficiency anemia on iron supplementation who presents for evaluation of Covid-like symptoms x 2 days.  No chest pain or shortness of breath, normal work of breathing, normal sats both at rest and with ambulation.  Differential diagnosis including viral URI versus Covid versus flu. flu swab negative.  Covid swabs pending.  Discussed supportive care and quarantine with patient.  Discussed my standard return precautions       As part of my medical decision making, I reviewed the following data within the [facility] notes reviewed and incorporated, Old chart reviewed, Notes from prior ED visits and [place] Controlled Substance Database   Please note:  Patient was evaluated in Emergency Department today for the symptoms described in the history of present illness. Patient was evaluated in the context of the global COVID-19  pandemic, which necessitated consideration that the patient might be at risk for infection with the SARS-CoV-2 virus that causes COVID-19. Institutional protocols and algorithms that pertain to the evaluation of patients at risk for COVID-19 are in a state of rapid change based on information released by regulatory bodies including the CDC and federal and state organizations. These policies and algorithms were followed during the patient's care in the ED.  Some ED evaluations and interventions may be delayed as a result of limited staffing during the pandemic.   ____________________________________________   FINAL CLINICAL IMPRESSION(S) / ED DIAGNOSES   Final diagnoses:  Suspected COVID-19 virus infection  Viral illness      NEW MEDICATIONS STARTED DURING THIS VISIT:  ED Discharge Orders    None       Note:  This document was prepared using Dragon voice recognition software and may include unintentional dictation errors.    Rudene Re, MD 02/24/19 [phone]

## 2019-02-25 ENCOUNTER — Other Ambulatory Visit: Payer: Self-pay | Admitting: Nurse Practitioner

## 2019-02-25 DIAGNOSIS — E6609 Other obesity due to excess calories: Secondary | ICD-10-CM

## 2019-02-25 DIAGNOSIS — U071 COVID-19: Secondary | ICD-10-CM

## 2019-02-25 NOTE — Progress Notes (Signed)
  I connected by phone with Curtis Sites on 02/25/2019 at 2:57 PM to discuss the potential use of an new treatment for mild to moderate COVID-19 viral infection in non-hospitalized patients.  This patient is a 48 y.o. female that meets the FDA criteria for Emergency Use Authorization of bamlanivimab or casirivimab\imdevimab.  Has a (+) direct SARS-CoV-2 viral test result  Has mild or moderate COVID-19   Is ? 50 years of age and weighs ? 40 kg  Is NOT hospitalized due to COVID-19  Is NOT requiring oxygen therapy or requiring an increase in baseline oxygen flow rate due to COVID-19  Is within 10 days of symptom onset  Has at least one of the high risk factor(s) for progression to severe COVID-19 and/or hospitalization as defined in EUA.  Specific high risk criteria : BMI >/= 35   I have spoken and communicated the following to the patient or parent/caregiver:  1. FDA has authorized the emergency use of bamlanivimab and casirivimab\imdevimab for the treatment of mild to moderate COVID-19 in adults and pediatric patients with positive results of direct SARS-CoV-2 viral testing who are 66 years of age and older weighing at least 40 kg, and who are at high risk for progressing to severe COVID-19 and/or hospitalization.  2. The significant known and potential risks and benefits of bamlanivimab and casirivimab\imdevimab, and the extent to which such potential risks and benefits are unknown.  3. Information on available alternative treatments and the risks and benefits of those alternatives, including clinical trials.  4. Patients treated with bamlanivimab and casirivimab\imdevimab should continue to self-isolate and use infection control measures (e.g., wear mask, isolate, social distance, avoid sharing personal items, clean and disinfect "high touch" surfaces, and frequent handwashing) according to CDC guidelines.   5. The patient or parent/caregiver has the option to accept or refuse  bamlanivimab or casirivimab\imdevimab .  After reviewing this information with the patient, The patient agreed to proceed with receiving the bamlanimivab infusion and will be provided a copy of the Fact sheet prior to receiving the infusion.Fenton Foy 02/25/2019 2:57 PM

## 2019-02-27 ENCOUNTER — Ambulatory Visit (HOSPITAL_COMMUNITY)
Admission: RE | Admit: 2019-02-27 | Discharge: 2019-02-27 | Disposition: A | Discharge status: Home / Self Care | Payer: HRSA Program | Source: Ambulatory Visit | Attending: Pulmonary Disease | Admitting: Pulmonary Disease

## 2019-02-27 DIAGNOSIS — Z6838 Body mass index (BMI) 38.0-38.9, adult: Secondary | ICD-10-CM | POA: Diagnosis present

## 2019-02-27 DIAGNOSIS — Z23 Encounter for immunization: Secondary | ICD-10-CM | POA: Diagnosis not present

## 2019-02-27 DIAGNOSIS — E6609 Other obesity due to excess calories: Secondary | ICD-10-CM | POA: Diagnosis present

## 2019-02-27 DIAGNOSIS — U071 COVID-19: Secondary | ICD-10-CM | POA: Diagnosis present

## 2019-02-27 MED ORDER — DIPHENHYDRAMINE HCL 50 MG/ML IJ SOLN: 50.0000 mg | Freq: Once | INTRAMUSCULAR | Status: DC | PRN

## 2019-02-27 MED ORDER — EPINEPHRINE 0.3 MG/0.3ML IJ SOAJ: 0.3000 mg | Freq: Once | INTRAMUSCULAR | Status: DC | PRN

## 2019-02-27 MED ORDER — FAMOTIDINE IN NACL 20-0.9 MG/50ML-% IV SOLN: 20.0000 mg | Freq: Once | INTRAVENOUS | Status: DC | PRN

## 2019-02-27 MED ORDER — SODIUM CHLORIDE 0.9 % IV SOLN
INTRAVENOUS | Status: DC | PRN
  Administered 2019-02-27: 250 mL via INTRAVENOUS

## 2019-02-27 MED ORDER — SODIUM CHLORIDE 0.9 % IV SOLN
700.0000 mg | Freq: Once | INTRAVENOUS | Status: AC
  Administered 2019-02-27: 700 mg via INTRAVENOUS
  Filled 2019-02-27: qty 20

## 2019-02-27 MED ORDER — ALBUTEROL SULFATE HFA 108 (90 BASE) MCG/ACT IN AERS: 2.0000 | INHALATION_SPRAY | Freq: Once | RESPIRATORY_TRACT | Status: DC | PRN

## 2019-02-27 MED ORDER — METHYLPREDNISOLONE SODIUM SUCC 125 MG IJ SOLR: 125.0000 mg | Freq: Once | INTRAMUSCULAR | Status: DC | PRN

## 2019-02-27 NOTE — Progress Notes (Signed)
  Diagnosis: COVID-19  Physician: Eulogio Bear, NP  Procedure: [facility] Infusion Clinic Med: bamlanivimab infusion - Provided patient with bamlanimivab fact sheet for patients, parents and caregivers prior to infusion.  Complications: No immediate complications noted.  Discharge: Discharged home   [place] 02/27/2019

## 2019-02-27 NOTE — Discharge Instructions (Signed)
10 Things You Can Do to Manage Your COVID-19 Symptoms at Home If you have possible or confirmed COVID-19: 1. Stay home from work and school. And stay away from other public places. If you must go out, avoid using any kind of public transportation, ridesharing, or taxis. 2. Monitor your symptoms carefully. If your symptoms get worse, call your healthcare provider immediately. 3. Get rest and stay hydrated. 4. If you have a medical appointment, call the healthcare provider ahead of time and tell them that you have or may have COVID-19. 5. For medical emergencies, call 911 and notify the dispatch personnel that you have or may have COVID-19. 6. Cover your cough and sneezes with a tissue or use the inside of your elbow. 7. Wash your hands often with soap and water for at least 20 seconds or clean your hands with an alcohol-based hand sanitizer that contains at least 60% alcohol. 8. As much as possible, stay in a specific room and away from other people in your home. Also, you should use a separate bathroom, if available. If you need to be around other people in or outside of the home, wear a mask. 9. Avoid sharing personal items with other people in your household, like dishes, towels, and bedding. 10. Clean all surfaces that are touched often, like counters, tabletops, and doorknobs. Use household cleaning sprays or wipes according to the label instructions. [email or web address] 08/29/2018 This information is not intended to replace advice given to you by your health care provider. Make sure you discuss any questions you have with your health care provider. Document Revised: 01/31/2019 Document Reviewed: 01/31/2019 Elsevier Patient Education  [place].  COVID-19 Frequently Asked Questions COVID-19 (coronavirus disease) is an infection that is caused by a large family of viruses. Some viruses cause illness in people and others cause illness in animals like camels, cats, and bats. In some  cases, the viruses that cause illness in animals can spread to humans. Where did the coronavirus come from? In December 2019, [place] told the [facility]) of several cases of lung disease (human respiratory illness). These cases were linked to an open seafood and livestock market in the city of [place]. The link to the seafood and livestock market suggests that the virus may have spread from animals to humans. However, since that first outbreak in December, the virus has also been shown to spread from person to person. What is the name of the disease and the virus? Disease name Early on, this disease was called novel coronavirus. This is because scientists determined that the disease was caused by a new (novel) respiratory virus. The World Health Organization [facility]) has now named the disease COVID-19, or coronavirus disease. Virus name The virus that causes the disease is called severe acute respiratory syndrome coronavirus 2 (SARS-CoV-2). More information on disease and virus naming World Health Organization [facility]): www.who.int/emergencies/diseases/novel-coronavirus-2019/technical-guidance/naming-the-coronavirus-disease-(covid-2019)-and-the-virus-that-causes-it Who is at risk for complications from coronavirus disease? Some people may be at higher risk for complications from coronavirus disease. This includes older adults and people who have chronic diseases, such as heart disease, diabetes, and lung disease. If you are at higher risk for complications, take these extra precautions:  Avoid close contact with people who are sick or have a fever or cough. Stay at least 3-6 ft (1-2 m) away from them, if possible.  Wash your hands often with soap and water for at least 20 seconds.  Avoid touching your face, mouth, nose, or eyes.  Keep supplies on hand  at home, such as food, medicine, and cleaning supplies.  Stay home as much as possible.  Avoid social gatherings and travel. How  does coronavirus disease spread? The virus that causes coronavirus disease spreads easily from person to person (is contagious). There are also cases of community-spread disease. This means the disease has spread to:  People who have no known contact with other infected people.  People who have not traveled to areas where there are known cases. It appears to spread from one person to another through droplets from coughing or sneezing. Can I get the virus from touching surfaces or objects? There is still a lot that we do not know about the virus that causes coronavirus disease. Scientists are basing a lot of information on what they know about similar viruses, such as:  Viruses cannot generally survive on surfaces for long. They need a human body (host) to survive.  It is more likely that the virus is spread by close contact with people who are sick (direct contact), such as through: ? Shaking hands or hugging. ? Breathing in respiratory droplets that travel through the air. This can happen when an infected person coughs or sneezes on or near other people.  It is less likely that the virus is spread when a person touches a surface or object that has the virus on it (indirect contact). The virus may be able to enter the body if the person touches a surface or object and then touches his or her face, eyes, nose, or mouth. Can a person spread the virus without having symptoms of the disease? It may be possible for the virus to spread before a person has symptoms of the disease, but this is most likely not the main way the virus is spreading. It is more likely for the virus to spread by being in close contact with people who are sick and breathing in the respiratory droplets of a sick person's cough or sneeze. What are the symptoms of coronavirus disease? Symptoms vary from person to person and can range from mild to severe. Symptoms may include:  Fever.  Cough.  Tiredness, weakness, or  fatigue.  Fast breathing or feeling short of breath. These symptoms can appear anywhere from 2 to 14 days after you have been exposed to the virus. If you develop symptoms, call your health care provider. People with severe symptoms may need hospital care. If I am exposed to the virus, how long does it take before symptoms start? Symptoms of coronavirus disease may appear anywhere from 2 to 14 days after a person has been exposed to the virus. If you develop symptoms, call your health care provider. Should I be tested for this virus? Your health care provider will decide whether to test you based on your symptoms, history of exposure, and your risk factors. How does a health care provider test for this virus? Health care providers will collect samples to send for testing. Samples may include:  Taking a swab of fluid from the nose.  Taking fluid from the lungs by having you cough up mucus (sputum) into a sterile cup.  Taking a blood sample.  Taking a stool or urine sample. Is there a treatment or vaccine for this virus? Currently, there is no vaccine to prevent coronavirus disease. Also, there are no medicines like antibiotics or antivirals to treat the virus. A person who becomes sick is given supportive care, which means rest and fluids. A person may also relieve his or her symptoms  by using over-the-counter medicines that treat sneezing, coughing, and runny nose. These are the same medicines that a person takes for the common cold. If you develop symptoms, call your health care provider. People with severe symptoms may need hospital care. What can I do to protect myself and my family from this virus?     You can protect yourself and your family by taking the same actions that you would take to prevent the spread of other viruses. Take the following actions:  Wash your hands often with soap and water for at least 20 seconds. If soap and water are not available, use alcohol-based hand  sanitizer.  Avoid touching your face, mouth, nose, or eyes.  Cough or sneeze into a tissue, sleeve, or elbow. Do not cough or sneeze into your hand or the air. ? If you cough or sneeze into a tissue, throw it away immediately and wash your hands.  Disinfect objects and surfaces that you frequently touch every day.  Avoid close contact with people who are sick or have a fever or cough. Stay at least 3-6 ft (1-2 m) away from them, if possible.  Stay home if you are sick, except to get medical care. Call your health care provider before you get medical care.  Make sure your vaccines are up to date. Ask your health care provider what vaccines you need. What should I do if I need to travel? Follow travel recommendations from your local health authority, the CDC, and WHO. Travel information and advice  Centers for Disease Control and Prevention (CDC): BodyEditor.hu  World Health Organization [facility]): ThirdIncome.ca Know the risks and take action to protect your health  You are at higher risk of getting coronavirus disease if you are traveling to areas with an outbreak or if you are exposed to travelers from areas with an outbreak.  Wash your hands often and practice good hygiene to lower the risk of catching or spreading the virus. What should I do if I am sick? General instructions to stop the spread of infection  Wash your hands often with soap and water for at least 20 seconds. If soap and water are not available, use alcohol-based hand sanitizer.  Cough or sneeze into a tissue, sleeve, or elbow. Do not cough or sneeze into your hand or the air.  If you cough or sneeze into a tissue, throw it away immediately and wash your hands.  Stay home unless you must get medical care. Call your health care provider or local health authority before you get medical care.  Avoid public areas. Do not take  public transportation, if possible.  If you can, wear a mask if you must go out of the house or if you are in close contact with someone who is not sick. Keep your home clean  Disinfect objects and surfaces that are frequently touched every day. This may include: ? Counters and tables. ? Doorknobs and light switches. ? Sinks and faucets. ? Electronics such as phones, remote controls, keyboards, computers, and tablets.  Wash dishes in hot, soapy water or use a dishwasher. Air-dry your dishes.  Wash laundry in hot water. Prevent infecting other household members  Let healthy household members care for children and pets, if possible. If you have to care for children or pets, wash your hands often and wear a mask.  Sleep in a different bedroom or bed, if possible.  Do not share personal items, such as razors, toothbrushes, deodorant, combs, brushes, towels, and washcloths.  Where to find more information Centers for Disease Control and Prevention (CDC)  Information and news updates: https://www.butler-gonzalez.com/ World Health Organization [facility])  Information and news updates: MissExecutive.com.ee  Coronavirus health topic: https://www.castaneda.info/  Questions and answers on COVID-19: OpportunityDebt.at  Global tracker: who.sprinklr.com American Academy of Pediatrics (AAP)  Information for families: www.healthychildren.org/English/health-issues/conditions/chest-lungs/Pages/2019-Novel-Coronavirus.aspx The coronavirus situation is changing rapidly. Check your local health authority website or the CDC and [facility] websites for updates and news. When should I contact a health care provider?  Contact your health care provider if you have symptoms of an infection, such as fever or cough, and you: ? Have been near anyone who is known to have coronavirus disease. ? Have come into contact with a person who is  suspected to have coronavirus disease. ? Have traveled outside of the country. When should I get emergency medical care?  Get help right away by calling your local emergency services (911 in the [place] if you have: ? Trouble breathing. ? Pain or pressure in your chest. ? Confusion. ? Blue-tinged lips and fingernails. ? Difficulty waking from sleep. ? Symptoms that get worse. Let the emergency medical personnel know if you think you have coronavirus disease. Summary  A new respiratory virus is spreading from person to person and causing COVID-19 (coronavirus disease).  The virus that causes COVID-19 appears to spread easily. It spreads from one person to another through droplets from coughing or sneezing.  Older adults and those with chronic diseases are at higher risk of disease. If you are at higher risk for complications, take extra precautions.  There is currently no vaccine to prevent coronavirus disease. There are no medicines, such as antibiotics or antivirals, to treat the virus.  You can protect yourself and your family by washing your hands often, avoiding touching your face, and covering your coughs and sneezes. This information is not intended to replace advice given to you by your health care provider. Make sure you discuss any questions you have with your health care provider. Document Released: 06/12/2018 Document Revised: 06/12/2018 Document Reviewed: 06/12/2018 Elsevier Patient Education  [place].

## 2019-06-18 IMAGING — MR MR HEAD W/O CM
10 series · 48 of 48 positions shown · non-contrast
Comparison: Head CT 09/05/2013

CLINICAL DATA: Dizziness with standing.

EXAM:
MRI HEAD WITHOUT CONTRAST
TECHNIQUE: Multiplanar, multiecho pulse sequences of the brain and surrounding
structures were obtained without intravenous contrast.

[Series 2: T1 · sagittal · 5.0mm · 0.45mm/px · 3 of 23 slices shown (1 of 2)]
[im 1/23]
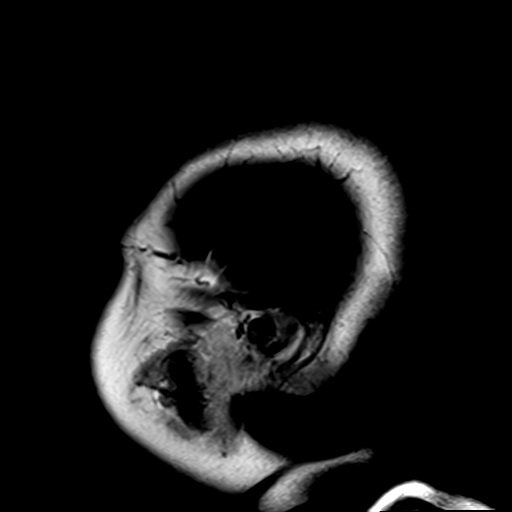
[im 12/23]
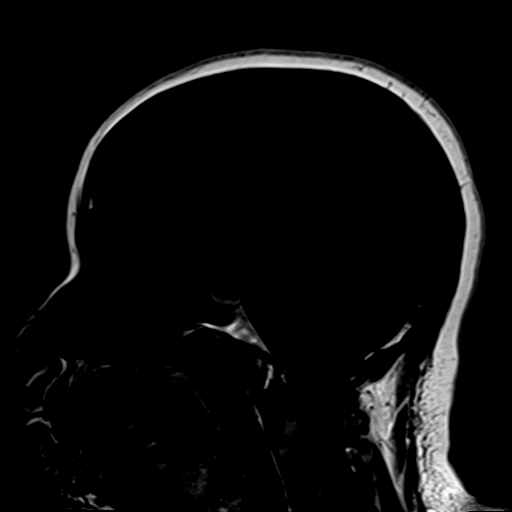
[im 23/23]
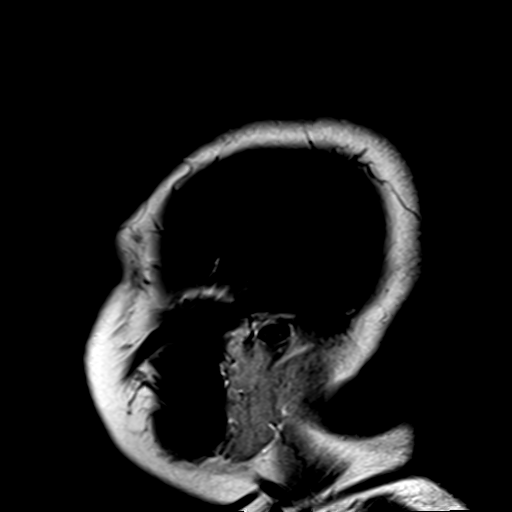

[Series 4: DWI · axial · 3.0mm · 1.80mm/px · z∈[-115,+35]mm · 6 of 51 slices shown (1 of 2)]
[im 1/51]
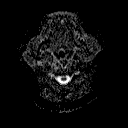
[im 11/51]
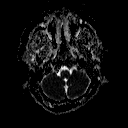
[im 21/51]
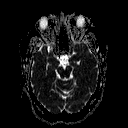
[im 31/51]
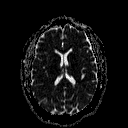
[im 41/51]
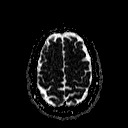
[im 51/51]
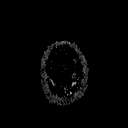

[Series 6: DWI · coronal · 3.0mm · 1.80mm/px · 5 of 45 slices shown (2 of 2)]
[im 1/45]
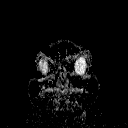
[im 12/45]
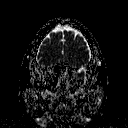
[im 23/45]
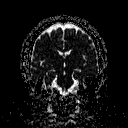
[im 34/45]
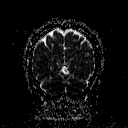
[im 45/45]
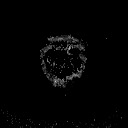

[Series 7: T2 · axial · 5.0mm · 0.60mm/px · z∈[-108,+35]mm · 2 of 23 slices shown (1 of 3)]
[im 1/23]
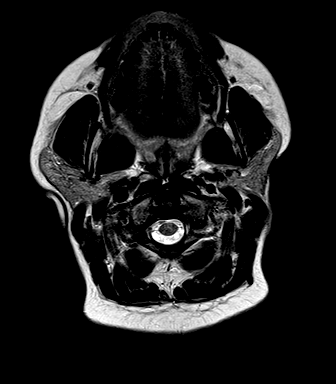
[im 23/23]
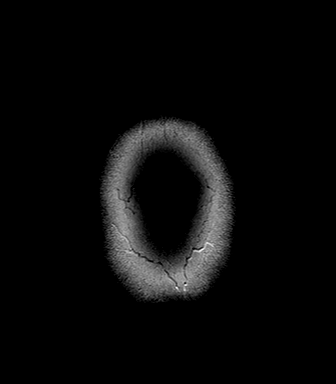

[Series 8: FLAIR · axial · 3.0mm · 0.45mm/px · z∈[-114,+41]mm · 5 of 53 slices shown]
[im 1/53]
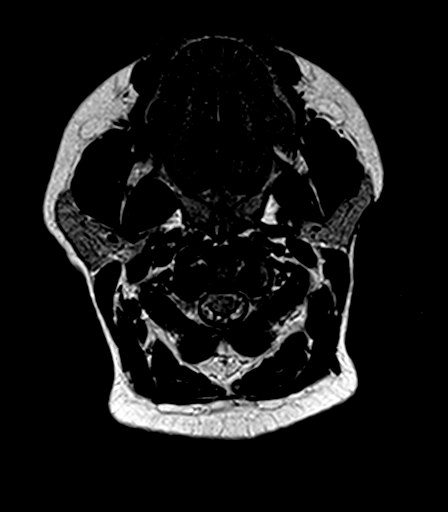
[im 14/53]
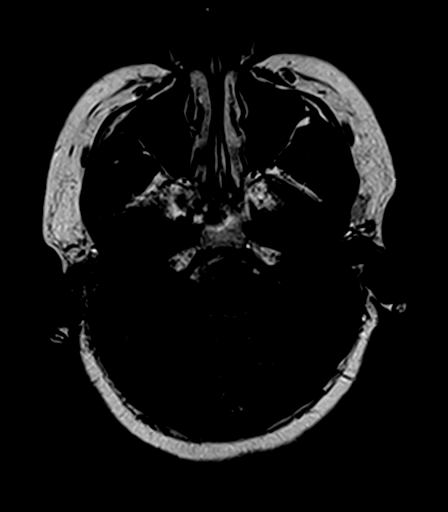
[im 27/53]
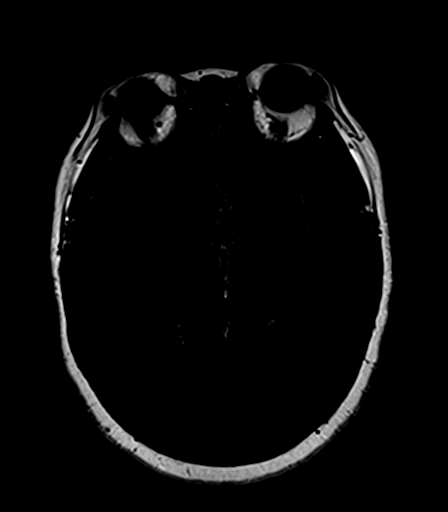
[im 40/53]
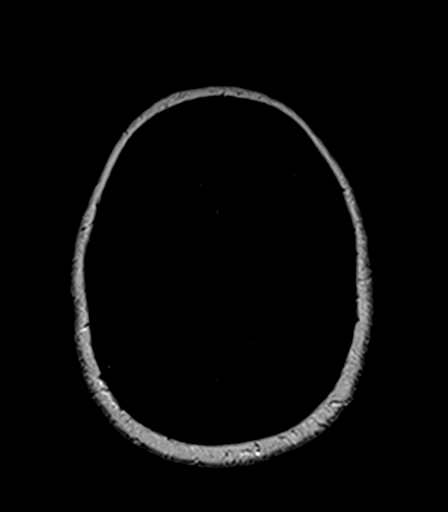
[im 53/53]
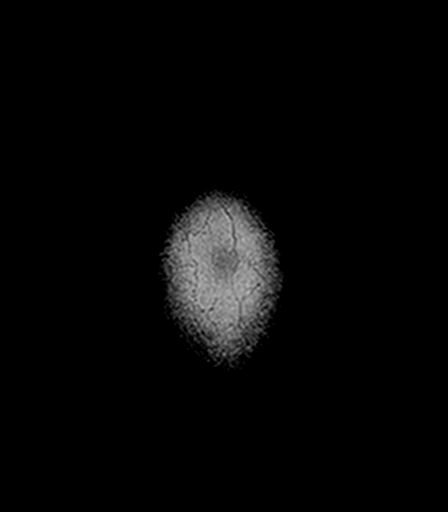

[Series 9: T2 · axial · 5.0mm · 0.45mm/px · z∈[-108,+35]mm · 2 of 23 slices shown (2 of 3)]
[im 1/23]
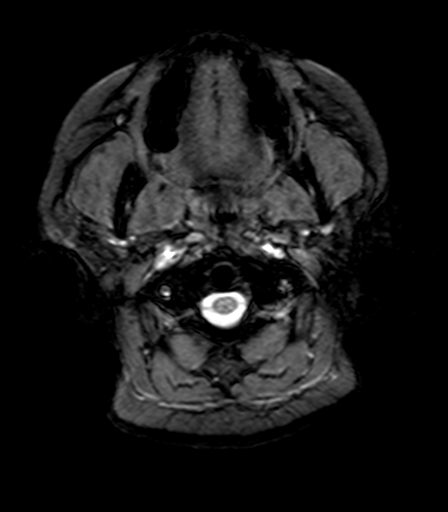
[im 23/23]
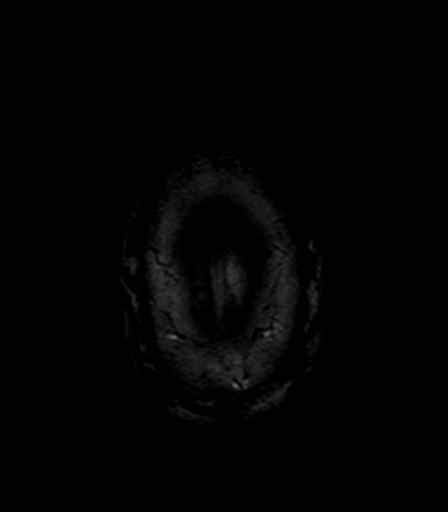

[Series 10: T1 · axial · 1.0mm · 1.00mm/px · z∈[-109,+34]mm · 14 of 144 slices shown (2 of 2)]
[im 1/144]
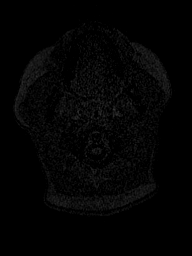
[im 12/144]
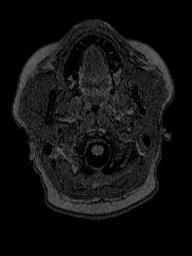
[im 23/144]
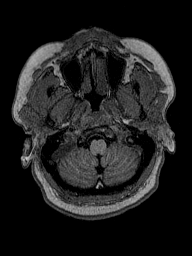
[im 34/144]
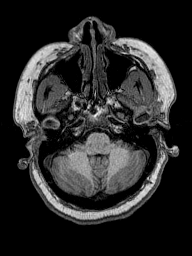
[im 45/144]
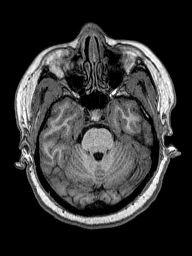
[im 56/144]
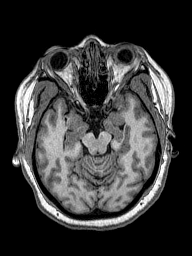
[im 67/144]
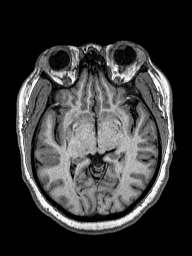
[im 78/144]
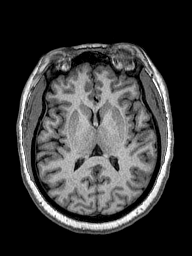
[im 89/144]
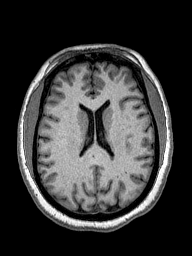
[im 100/144]
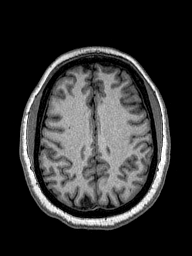
[im 111/144]
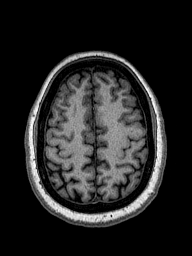
[im 122/144]
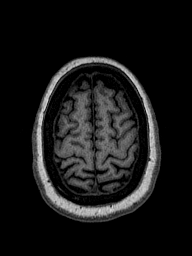
[im 133/144]
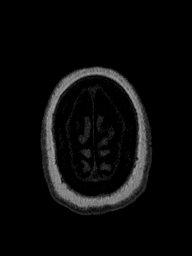
[im 144/144]
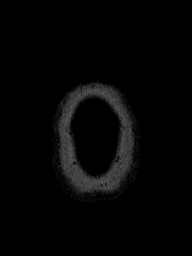

[Series 11: T2 · coronal · 5.0mm · 0.49mm/px · 2 of 25 slices shown (3 of 3)]
[im 1/25]
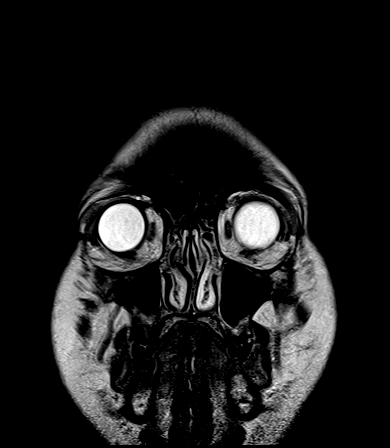
[im 25/25]
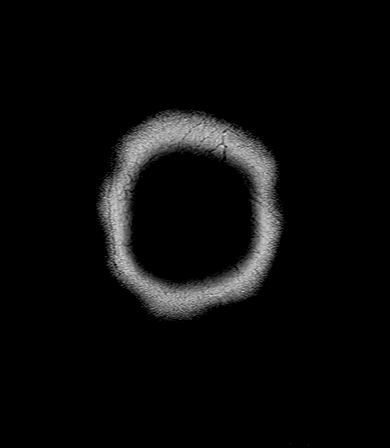

[Series 100: (id) ax · axial · 3.0mm · 1.80mm/px · z∈[-115,+35]mm · 5 of 51 slices shown]
[im 1/51]
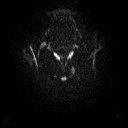
[im 13/51]
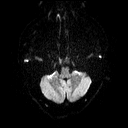
[im 26/51]
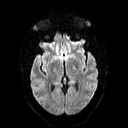
[im 38/51]
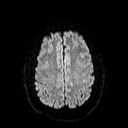
[im 51/51]
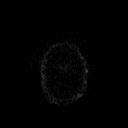

[Series 101: (id) cor · coronal · 3.0mm · 1.80mm/px · 4 of 45 slices shown]
[im 1/45]
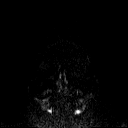
[im 15/45]
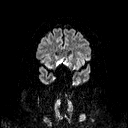
[im 30/45]
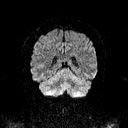
[im 45/45]
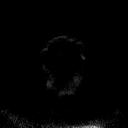

[48 of 48 positions shown; findings below may reference images not displayed]

FINDINGS: BRAIN: There is no acute infarct, acute hemorrhage or mass effect.
The midline structures are normal. There are no old infarcts. The
white matter signal is normal for the patient's age. The CSF spaces
are normal for age, with no hydrocephalus. Susceptibility-sensitive
sequences show no chronic microhemorrhage or superficial siderosis.

VASCULAR: Major intracranial arterial and venous sinus flow voids
are preserved.

SKULL AND UPPER CERVICAL SPINE: The visualized skull base,
calvarium, upper cervical spine and extracranial soft tissues are
normal.

SINUSES/ORBITS: No fluid levels or advanced mucosal thickening. No
mastoid or middle ear effusion. The orbits are normal.
IMPRESSION: Normal brain MRI.

## 2019-09-10 ENCOUNTER — Other Ambulatory Visit: Payer: Self-pay

## 2019-09-10 ENCOUNTER — Emergency Department
Admission: EM | Admit: 2019-09-10 | Discharge: 2019-09-10 | Disposition: A | Discharge status: Home / Self Care | Payer: Self-pay | Attending: Emergency Medicine | Admitting: Emergency Medicine

## 2019-09-10 ENCOUNTER — Emergency Department: Payer: Self-pay

## 2019-09-10 ENCOUNTER — Encounter: Payer: Self-pay | Admitting: Emergency Medicine

## 2019-09-10 DIAGNOSIS — R102 Pelvic and perineal pain: Secondary | ICD-10-CM | POA: Insufficient documentation

## 2019-09-10 DIAGNOSIS — N938 Other specified abnormal uterine and vaginal bleeding: Secondary | ICD-10-CM | POA: Insufficient documentation

## 2019-09-10 DIAGNOSIS — N888 Other specified noninflammatory disorders of cervix uteri: Secondary | ICD-10-CM | POA: Insufficient documentation

## 2019-09-10 DIAGNOSIS — Z91040 Latex allergy status: Secondary | ICD-10-CM | POA: Insufficient documentation

## 2019-09-10 DIAGNOSIS — Z87891 Personal history of nicotine dependence: Secondary | ICD-10-CM | POA: Insufficient documentation

## 2019-09-10 LAB — COMPREHENSIVE METABOLIC PANEL
ALT: 17 U/L (ref 0–44)
AST: 21 U/L (ref 15–41)
Albumin: 4.1 g/dL (ref 3.5–5.0)
Alkaline Phosphatase: 66 U/L (ref 38–126)
Anion gap: 6 (ref 5–15)
BUN: 11 mg/dL (ref 6–20)
CO2: 24 mmol/L (ref 22–32)
Calcium: 8.7 mg/dL — ABNORMAL LOW (ref 8.9–10.3)
Chloride: 108 mmol/L (ref 98–111)
Creatinine, Ser: 0.88 mg/dL (ref 0.44–1.00)
GFR calc Af Amer: >60 mL/min (ref >60)
GFR calc non Af Amer: >60 mL/min (ref >60)
Glucose, Bld: 130 mg/dL — ABNORMAL HIGH (ref 70–99)
Potassium: 3.7 mmol/L (ref 3.5–5.1)
Sodium: 138 mmol/L (ref 135–145)
Total Bilirubin: 0.6 mg/dL (ref 0.3–1.2)
Total Protein: 8.1 g/dL (ref 6.5–8.1)

## 2019-09-10 LAB — URINALYSIS, COMPLETE (UACMP) WITH MICROSCOPIC
Bilirubin Urine: NEGATIVE
Glucose, UA: NEGATIVE mg/dL
Ketones, ur: NEGATIVE mg/dL
Leukocytes,Ua: NEGATIVE
Nitrite: NEGATIVE
Protein, ur: 30 mg/dL — AB
Specific Gravity, Urine: 1.029 (ref 1.005–1.030)
pH: 5 (ref 5.0–8.0)

## 2019-09-10 LAB — CBC
HCT: 37.1 % (ref 36.0–46.0)
Hemoglobin: 12 g/dL (ref 12.0–15.0)
MCH: 24.3 pg — ABNORMAL LOW (ref 26.0–34.0)
MCHC: 32.3 g/dL (ref 30.0–36.0)
MCV: 75.3 fL — ABNORMAL LOW (ref 80.0–100.0)
Platelets: 301 10*3/uL (ref 150–400)
RBC: 4.93 MIL/uL (ref 3.87–5.11)
RDW: 17.3 % — ABNORMAL HIGH (ref 11.5–15.5)
WBC: 8.3 10*3/uL (ref 4.0–10.5)
nRBC: 0 % (ref 0.0–0.2)

## 2019-09-10 LAB — POCT PREGNANCY, URINE: Preg Test, Ur: NEGATIVE

## 2019-09-10 LAB — LIPASE, BLOOD: Lipase: 25 U/L (ref 11–51)

## 2019-09-10 MED ORDER — ONDANSETRON HCL 4 MG/2ML IJ SOLN
4.0000 mg | Freq: Once | INTRAMUSCULAR | Status: AC
  Administered 2019-09-10: 4 mg via INTRAVENOUS
  Filled 2019-09-10: qty 2

## 2019-09-10 MED ORDER — KETOROLAC TROMETHAMINE 30 MG/ML IJ SOLN
30.0000 mg | Freq: Once | INTRAMUSCULAR | Status: AC
  Administered 2019-09-10: 30 mg via INTRAVENOUS
  Filled 2019-09-10: qty 1

## 2019-09-10 MED ORDER — ACETAMINOPHEN 500 MG PO TABS
ORAL_TABLET | ORAL | Status: AC
  Filled 2019-09-10: qty 2

## 2019-09-10 MED ORDER — OXYCODONE-ACETAMINOPHEN 5-325 MG PO TABS: 1.0000 | ORAL_TABLET | Freq: Four times a day (QID) | ORAL | 0 refills | Status: AC | PRN

## 2019-09-10 MED ORDER — ONDANSETRON HCL 4 MG/2ML IJ SOLN
4.0000 mg | Freq: Once | INTRAMUSCULAR | Status: AC | PRN
  Administered 2019-09-10: 4 mg via INTRAVENOUS
  Filled 2019-09-10: qty 2

## 2019-09-10 MED ORDER — ONDANSETRON 4 MG PO TBDP: 4.0000 mg | ORAL_TABLET | Freq: Three times a day (TID) | ORAL | 0 refills | Status: DC | PRN

## 2019-09-10 MED ORDER — HYDROMORPHONE HCL 1 MG/ML IJ SOLN
1.0000 mg | Freq: Once | INTRAMUSCULAR | Status: AC
  Administered 2019-09-10: 1 mg via INTRAVENOUS
  Filled 2019-09-10: qty 1

## 2019-09-10 MED ORDER — IBUPROFEN 600 MG PO TABS: 600.0000 mg | ORAL_TABLET | Freq: Three times a day (TID) | ORAL | 0 refills | Status: DC | PRN

## 2019-09-10 MED ORDER — SODIUM CHLORIDE 0.9% FLUSH
3.0000 mL | Freq: Once | INTRAVENOUS | Status: AC
  Administered 2019-09-10: 3 mL via INTRAVENOUS

## 2019-09-10 MED ORDER — IOHEXOL 300 MG/ML  SOLN
100.0000 mL | Freq: Once | INTRAMUSCULAR | Status: AC | PRN
  Administered 2019-09-10: 100 mL via INTRAVENOUS
  Filled 2019-09-10: qty 100

## 2019-09-10 MED ORDER — KETOROLAC TROMETHAMINE 30 MG/ML IJ SOLN: 15.0000 mg | Freq: Once | INTRAMUSCULAR | Status: DC

## 2019-09-10 MED ORDER — OXYCODONE-ACETAMINOPHEN 5-325 MG PO TABS
1.0000 | ORAL_TABLET | Freq: Once | ORAL | Status: AC
  Administered 2019-09-10: 1 via ORAL
  Filled 2019-09-10: qty 1

## 2019-09-10 MED ORDER — OXYCODONE-ACETAMINOPHEN 5-325 MG PO TABS: 1.0000 | ORAL_TABLET | Freq: Four times a day (QID) | ORAL | 0 refills | Status: DC | PRN

## 2019-09-10 MED ORDER — MORPHINE SULFATE (PF) 4 MG/ML IV SOLN
4.0000 mg | Freq: Once | INTRAVENOUS | Status: AC
  Administered 2019-09-10: 4 mg via INTRAVENOUS
  Filled 2019-09-10: qty 1

## 2019-09-10 MED ORDER — ACETAMINOPHEN 500 MG PO TABS
1000.0000 mg | ORAL_TABLET | Freq: Once | ORAL | Status: AC
  Administered 2019-09-10: 1000 mg via ORAL

## 2019-09-10 NOTE — ED Notes (Signed)
Pt vomiting over trash can. Pt brought to triage for medications.

## 2019-09-10 NOTE — ED Notes (Signed)
Pt's care discussed with Dr. Jimmye Norman, Windy Carina for 1gram Tylenol.

## 2019-09-10 NOTE — ED Triage Notes (Signed)
First nurse note- here for abdominal pain. Appears uncomfortable.

## 2019-09-10 NOTE — ED Triage Notes (Signed)
Pt presents to ED via POV with c/o lower abdominal pain. Pt states started menstrual cycle yesterday and thought pain was related that, however reports pain is different. Pt appears uncomfortable in triage. Pt c/o suprapubic abdominal pain, denies N/V/D at this time. VSS.

## 2019-09-10 NOTE — Discharge Instructions (Addendum)
Call OBGYN tomorrow to set up an appointment in the next 24-48 hours for follow-up of your scans and imaging

## 2019-09-10 NOTE — ED Provider Notes (Signed)
[facility] Emergency Department Provider Note  ____________________________________________   First MD Initiated Contact with Patient 09/10/19 1205     (approximate)  I have reviewed the triage vital signs and the nursing notes.   HISTORY  Chief Complaint Abdominal Pain    HPI Courtney Cruz is a 49 y.o. female  Here with abdominal pain, vaginal bleeding. Pt reports that sx started 2-3 days ago as severe, aching, cramping lower midline abdominal and pelvic pain. She has had associated vaginal bleeding after starting her period yesterday. She is due for her period but states current pain is more severe than usual. Feels like a pulling,cramping sensation like she needs to pass a clot. No alleviating factors other than slight improvement with Advil PM last night. No fever, chills. No urinary symptoms. H/o recent slightly irregular periods, but otherwise no h/o menopausal symptoms.        Past Medical History:  Diagnosis Date  . Anemia     There are no problems to display for this patient.   Past Surgical History:  Procedure Laterality Date  . CESAREAN SECTION      Prior to Admission medications   Medication Sig Start Date End Date Taking? Authorizing Provider  ferrous sulfate 325 (65 FE) MG EC tablet Take 1 tablet by mouth every morning. 08/19/19  Yes [provider]  ibuprofen (ADVIL) 600 MG tablet Take 1 tablet (600 mg total) by mouth every 8 (eight) hours as needed for mild pain. 09/10/19   Duffy Bruce, MD  ondansetron (ZOFRAN ODT) 4 MG disintegrating tablet Take 1 tablet (4 mg total) by mouth every 8 (eight) hours as needed for nausea or vomiting. 09/10/19   Duffy Bruce, MD  oxyCODONE-acetaminophen (PERCOCET) 5-325 MG tablet Take 1-2 tablets by mouth every 6 (six) hours as needed for moderate pain or severe pain. 09/10/19 09/09/20  Duffy Bruce, MD    Allergies Sumatriptan and Latex  No family history on file.  Social  History Social History   Tobacco Use  . Smoking status: Former Research scientist (life sciences)  . Smokeless tobacco: Never Used  Substance Use Topics  . Alcohol use: No  . Drug use: Not on file    Review of Systems  Review of Systems  Constitutional: Positive for fatigue. Negative for fever.  HENT: Negative for congestion and sore throat.   Eyes: Negative for visual disturbance.  Respiratory: Negative for cough and shortness of breath.   Cardiovascular: Negative for chest pain.  Gastrointestinal: Positive for abdominal pain and nausea. Negative for diarrhea and vomiting.  Genitourinary: Positive for vaginal bleeding and vaginal pain. Negative for flank pain.  Musculoskeletal: Negative for back pain and neck pain.  Skin: Negative for rash and wound.  Neurological: Negative for weakness.  All other systems reviewed and are negative.    ____________________________________________  PHYSICAL EXAM:      VITAL SIGNS: ED Triage Vitals [09/10/19 0953]  Enc Vitals Group     BP 132/86     Pulse Rate 77     Resp (!) 22     Temp 98.3 F (36.8 C)     Temp Source Oral     SpO2 98 %     Weight 190 lb (86.2 kg)     Height 4\' 11"  (1.499 m)     Head Circumference      Peak Flow      Pain Score 10     Pain Loc      Pain Edu?  Excl. in [place]?      Physical Exam Vitals and nursing note reviewed.  Constitutional:      General: She is not in acute distress.    Appearance: She is well-developed.  HENT:     Head: Normocephalic and atraumatic.  Eyes:     Conjunctiva/sclera: Conjunctivae normal.  Cardiovascular:     Rate and Rhythm: Normal rate and regular rhythm.     Heart sounds: Normal heart sounds. No murmur heard.  No friction rub.  Pulmonary:     Effort: Pulmonary effort is normal. No respiratory distress.     Breath sounds: Normal breath sounds. No wheezing or rales.  Abdominal:     General: There is no distension.     Palpations: Abdomen is soft.     Tenderness: There is abdominal  tenderness in the suprapubic area. There is no guarding or rebound.  Musculoskeletal:     Cervical back: Neck supple.  Skin:    General: Skin is warm.     Capillary Refill: Capillary refill takes less than 2 seconds.  Neurological:     Mental Status: She is alert and oriented to person, place, and time.     Motor: No abnormal muscle tone.       ____________________________________________   LABS (all labs ordered are listed, but only abnormal results are displayed)  Labs Reviewed  COMPREHENSIVE METABOLIC PANEL - Abnormal; Notable for the following components:      Result Value   Glucose, Bld 130 (*)    Calcium 8.7 (*)    All other components within normal limits  CBC - Abnormal; Notable for the following components:   MCV 75.3 (*)    MCH 24.3 (*)    RDW 17.3 (*)    All other components within normal limits  URINALYSIS, COMPLETE (UACMP) WITH MICROSCOPIC - Abnormal; Notable for the following components:   Color, Urine YELLOW (*)    APPearance HAZY (*)    Hgb urine dipstick LARGE (*)    Protein, ur 30 (*)    Bacteria, UA RARE (*)    All other components within normal limits  LIPASE, BLOOD  POC URINE PREG, ED  POCT PREGNANCY, URINE    ____________________________________________  EKG: None ________________________________________  RADIOLOGY All imaging, including plain films, CT scans, and ultrasounds, independently reviewed by me, and interpretations confirmed via formal radiology reads.  ED MD interpretation:   TVUS: Heterogenous cervical mass with significant cervical distension CT: Possible necrotic fibroid  Official radiology report(s): CT ABDOMEN PELVIS W CONTRAST  Result Date: 09/10/2019 CLINICAL DATA:  Lower abdominal pain EXAM: CT ABDOMEN AND PELVIS WITH CONTRAST TECHNIQUE: Multidetector CT imaging of the abdomen and pelvis was performed using the standard protocol following bolus administration of intravenous contrast. CONTRAST:  184mL OMNIPAQUE IOHEXOL  300 MG/ML  SOLN COMPARISON:  None. FINDINGS: Lower chest: The visualized heart size within normal limits. No pericardial fluid/thickening. There is a small hiatal hernia present. The visualized portions of the lungs are clear. Hepatobiliary: The liver is normal in density without focal abnormality.The main portal vein is patent. No evidence of calcified gallstones, gallbladder wall thickening or biliary dilatation. Pancreas: Unremarkable. No pancreatic ductal dilatation or surrounding inflammatory changes. Spleen: Normal in size without focal abnormality. Adrenals/Urinary Tract: Both adrenal glands appear normal. The kidneys and collecting system appear normal without evidence of urinary tract calculus or hydronephrosis. There is a small hyperdense rounded area in the posterior left bladder which could represent small amount of blood/proteinaceous debris. Stomach/Bowel: The stomach,  small bowel, and colon are normal in appearance. No inflammatory changes, wall thickening, or obstructive findings.The appendix is normal. Vascular/Lymphatic: There are no enlarged mesenteric, retroperitoneal, or pelvic lymph nodes. No significant vascular findings are present. Reproductive: There is a heterogeneous hypodense submucosal mass within the uterus extending into the cervical canal. This is better evaluated on the pelvic ultrasound same day. There is also a small amount of fluid in the endometrial canal. Other: No evidence of abdominal wall mass or hernia. Musculoskeletal: No acute or significant osseous findings. IMPRESSION: Heterogeneous hypodense mass within the submucosal lower uterine segment extending into the cervical canal which could represent a partially necrotic fibroid, this was better evaluated on the pelvic ultrasound same day. Small hyperdense area in the posterior left bladder which could represent hemorrhagic/proteinaceous debris. Electronically Signed   By: Prudencio Pair M.D.   On: 09/10/2019 17:06   US  PELVIC COMPLETE WITH TRANSVAGINAL  Result Date: 09/10/2019 CLINICAL DATA:  Pelvic pain times 24 hours EXAM: TRANSABDOMINAL AND TRANSVAGINAL ULTRASOUND OF PELVIS TECHNIQUE: Both transabdominal and transvaginal ultrasound examinations of the pelvis were performed. Transabdominal technique was performed for global imaging of the pelvis including uterus, ovaries, adnexal regions, and pelvic cul-de-sac. It was necessary to proceed with endovaginal exam following the transabdominal exam to visualize the cervix to a better extent. COMPARISON:  None FINDINGS: Uterus Measurements: 14.7 x 5.7 x 5.9 cm = volume: 260 mL. The uterus demonstrates a neutral lie. There is fluid within the endometrial cavity which appears to communicate with a dilated, fluid-filled cervix demonstrating a fluid fluid level. Additionally, there is a heterogeneous, mixed cystic and solid mass within the cervix that is suspicious for a primary cervical neoplasm, best seen on cine images and static image # 60. This heterogeneous mass measures at least 5.7 x 4.5 x 4.4 cm and, as noted above, demonstrates some degree of internal degeneration demonstrating a fluid fluid level within the lower uterine segment/cervix. The mass appears to involve the lower uterine segment, but does not appear to extend into the parametrial soft tissues or into the vagina. The uterus is better seen on transabdominal images and, aside from a small amount of fluid within the endometrial cavity, appears unremarkable. Endometrium Thickness: Is not optimally visualized, particularly secondary to fluid within the endometrial cavity, but does not appear thickened measuring roughly 3 mm in thickness. Right ovary Not visualized Left ovary Not visualized Other findings No abnormal free fluid. IMPRESSION: Complex heterogeneous partially necrotic mass suspected within the cervix, resulting in marked distension of the cervix and extension into the lower uterine segment. No definite intra  vaginal or parametrial extension on this examination. A primary cervical neoplasm is the diagnosis of exclusion and correlation with. Direct inspection of the cervix is recommended. Contrast enhanced MRI examination name also be helpful for further staging. These results were called by telephone at the time of interpretation on 09/10/2019 at 3:32 pm to provider Duffy Bruce , who verbally acknowledged these results. Electronically Signed   By: Fidela Salisbury MD   On: 09/10/2019 15:33    ____________________________________________  PROCEDURES   Procedure(s) performed (including Critical Care):  Procedures  ____________________________________________  INITIAL IMPRESSION / MDM / [place] / ED COURSE  As part of my medical decision making, I reviewed the following data within the [place] notes reviewed and incorporated, Old chart reviewed, Notes from prior ED visits, and [place] Controlled Substance Database       *NAYOMI TABRON was evaluated in  Emergency Department on 09/10/2019 for the symptoms described in the history of present illness. She was evaluated in the context of the global COVID-19 pandemic, which necessitated consideration that the patient might be at risk for infection with the SARS-CoV-2 virus that causes COVID-19. Institutional protocols and algorithms that pertain to the evaluation of patients at risk for COVID-19 are in a state of rapid change based on information released by regulatory bodies including the CDC and federal and state organizations. These policies and algorithms were followed during the patient's care in the ED.  Some ED evaluations and interventions may be delayed as a result of limited staffing during the pandemic.*  Clinical Course as of Sep 10 2015  Tue Sep 10, 2019  1326 49 yo F here with suspected DUB and uterine cramping related to clot passage. Abdomen soft, non-distended w/o focal TTP to suggest appendicitis,  diverticulitis, or other intra-abd pathology. Sx correlate with recent irregularity in cycles. Will check [place] to eval for uterine abnormalities, torsion, and give analgesics. No urinary symptoms, doubt UTI clinically.    [CI]  1427 U/S shows large amount of debris in vaginal vault.   [CI]  1812 U/S concerning for cervical mass. D/w Dr. Glennon Mac - will check CT. If pain is improving, could likely d/c with close outpatient follow-up.   [CI]  6283 CT scan is more c/w fibroid which is hopefully the case. Pt feels much improved and is tolerating PO. Will have her call Dr. Marisue [place] office in AM for urgent f/u. Discussed ddx includnig cervical cancer.    [CI]    Clinical Course User Index [CI] Duffy Bruce, MD    Medical Decision Making:  As above. Pt feels much better after meds. Suspect painful fibroid vs cervical mass/cancer causing cervical stenosis. Pt tolerating PO. Hgb is stable. Will d/c with close OB fu in next 24-48 hr, analgesia, and good return precautions.  ____________________________________________  FINAL CLINICAL IMPRESSION(S) / ED DIAGNOSES  Final diagnoses:  Pelvic pain  Cervical mass  Dysfunctional uterine bleeding     MEDICATIONS GIVEN DURING THIS VISIT:  Medications  sodium chloride flush (NS) 0.9 % injection 3 mL (3 mLs Intravenous Given 09/10/19 1233)  acetaminophen (TYLENOL) tablet 1,000 mg (1,000 mg Oral Given 09/10/19 1001)  ondansetron (ZOFRAN) injection 4 mg (4 mg Intravenous Given 09/10/19 1043)  ketorolac (TORADOL) 30 MG/ML injection 30 mg (30 mg Intravenous Given 09/10/19 1228)  morphine 4 MG/ML injection 4 mg (4 mg Intravenous Given 09/10/19 1229)  ondansetron (ZOFRAN) injection 4 mg (4 mg Intravenous Given 09/10/19 1228)  HYDROmorphone (DILAUDID) injection 1 mg (1 mg Intravenous Given 09/10/19 1600)  iohexol (OMNIPAQUE) 300 MG/ML solution 100 mL (100 mLs Intravenous Contrast Given 09/10/19 1646)  oxyCODONE-acetaminophen (PERCOCET/ROXICET) 5-325 MG per tablet  1 tablet (1 tablet Oral Given 09/10/19 1806)  ondansetron (ZOFRAN) injection 4 mg (4 mg Intravenous Given 09/10/19 1804)     ED Discharge Orders         Ordered    oxyCODONE-acetaminophen (PERCOCET) 5-325 MG tablet  Every 6 hours PRN,   Status:  Discontinued     Reprint     09/10/19 1729    ibuprofen (ADVIL) 600 MG tablet  Every 8 hours PRN,   Status:  Discontinued     Reprint     09/10/19 1729    ondansetron (ZOFRAN ODT) 4 MG disintegrating tablet  Every 8 hours PRN,   Status:  Discontinued     Reprint     09/10/19 1729  ibuprofen (ADVIL) 600 MG tablet  Every 8 hours PRN     Discontinue  Reprint     09/10/19 1731    ondansetron (ZOFRAN ODT) 4 MG disintegrating tablet  Every 8 hours PRN     Discontinue  Reprint     09/10/19 1731    oxyCODONE-acetaminophen (PERCOCET) 5-325 MG tablet  Every 6 hours PRN     Discontinue  Reprint     09/10/19 1731           Note:  This document was prepared using Dragon voice recognition software and may include unintentional dictation errors.   Duffy Bruce, MD 09/10/19 2017

## 2019-09-19 ENCOUNTER — Other Ambulatory Visit (HOSPITAL_COMMUNITY)
Admission: RE | Admit: 2019-09-19 | Discharge: 2019-09-19 | Disposition: A | Discharge status: Home / Self Care | Payer: Self-pay | Source: Ambulatory Visit | Attending: Obstetrics and Gynecology | Admitting: Obstetrics and Gynecology

## 2019-09-19 ENCOUNTER — Other Ambulatory Visit: Payer: Self-pay

## 2019-09-19 ENCOUNTER — Encounter: Payer: Self-pay | Admitting: Obstetrics and Gynecology

## 2019-09-19 ENCOUNTER — Ambulatory Visit (INDEPENDENT_AMBULATORY_CARE_PROVIDER_SITE_OTHER): Payer: Self-pay | Admitting: Obstetrics and Gynecology

## 2019-09-19 VITALS — BP 148/84 | HR 76 | Ht 59.0 in | Wt 201.0 lb

## 2019-09-19 DIAGNOSIS — N888 Other specified noninflammatory disorders of cervix uteri: Secondary | ICD-10-CM

## 2019-09-19 NOTE — Progress Notes (Signed)
Obstetrics & Gynecology Office Visit    Chief Complaint  Patient presents with   Follow-up    ER follow up 7/13   History of Present Illness: 49 y.o. [id number] female who presents in follow up from the ER on 09/10/2019 for abdominal pain and vaginal bleeding. She was found to have a cervical mass on imaging during her visit. She is feeling better since her visit to the ER.  For years when her period would come on she would pass clots or see blood when she voided.  She recently missed a couple of periods. She had negative pregnancy tests.  She goes to [facility] in [place].  She sees Eulogio Bear, NP, at [facility] in [place].  She does not remember the last pap smear she had, it appears in [place] that it was about 2014 that she had her last pap smear.  It appears to be normal.   She notes bloating.  She states that she has a history of anemia and she takes oral iron pills.  She states she has gained some weight (~10 pounds).    Past Medical History:  Diagnosis Date   Anemia    Uterine fibroid    Past Surgical History:  Procedure Laterality Date   CESAREAN SECTION     Gynecologic History: Patient's last menstrual period was 09/09/2019.  Obstetric History: [id number] (one child no longer living)  Family History  Problem Relation Age of Onset   Diabetes Maternal Grandfather    Hypertension Maternal Grandfather    Cancer Neg Hx     Social History   Socioeconomic History   Marital status: Married    Spouse name: Not on file   Number of children: Not on file   Years of education: Not on file   Highest education level: Not on file  Occupational History   Not on file  Tobacco Use   Smoking status: Former Smoker   Smokeless tobacco: Never Used  Scientific laboratory technician Use: Never used  Substance and Sexual Activity   Alcohol use: Yes    Comment: Occ   Drug use: Never   Sexual activity: Yes    Birth control/protection: None  Other Topics  Concern   Not on file  Social History Narrative   Not on file   Social Determinants of Health   Financial Resource Strain:    Difficulty of Paying Living Expenses:   Food Insecurity:    Worried About Charity fundraiser in the Last Year:    Arboriculturist in the Last Year:   Transportation Needs:    Film/video editor (Medical):    Lack of Transportation (Non-Medical):   Physical Activity:    Days of Exercise per Week:    Minutes of Exercise per Session:   Stress:    Feeling of Stress :   Social Connections:    Frequency of Communication with Friends and Family:    Frequency of Social Gatherings with Friends and Family:    Attends Religious Services:    Active Member of Clubs or Organizations:    Attends Archivist Meetings:    Marital Status:   Intimate Partner Violence:    Fear of Current or Ex-Partner:    Emotionally Abused:    Physically Abused:    Sexually Abused:     Allergies  Allergen Reactions   Sumatriptan Anaphylaxis and Shortness Of Breath   Latex Rash    Prior to Admission  medications   Medication Sig Start Date End Date Taking? Authorizing Provider  ferrous sulfate 325 (65 FE) MG EC tablet Take 1 tablet by mouth every morning. 08/19/19  Yes [provider]  ibuprofen (ADVIL) 600 MG tablet Take 1 tablet (600 mg total) by mouth every 8 (eight) hours as needed for mild pain. 09/10/19  Yes Duffy Bruce, MD  ondansetron (ZOFRAN ODT) 4 MG disintegrating tablet Take 1 tablet (4 mg total) by mouth every 8 (eight) hours as needed for nausea or vomiting. 09/10/19  Yes Duffy Bruce, MD  oxyCODONE-acetaminophen (PERCOCET) 5-325 MG tablet Take 1-2 tablets by mouth every 6 (six) hours as needed for moderate pain or severe pain. 09/10/19 09/09/20 Yes Duffy Bruce, MD    Review of Systems  Constitutional: Negative.   HENT: Negative.   Eyes: Negative.   Respiratory: Negative.   Cardiovascular: Negative.     Gastrointestinal: Negative.   Genitourinary: Negative.   Musculoskeletal: Negative.   Skin: Negative.   Neurological: Negative.   Psychiatric/Behavioral: Negative.      Physical Exam BP (!) 148/84    Pulse 76    Ht 4\' 11"  (1.499 m)    Wt 201 lb (91.2 kg)    LMP 09/09/2019    BMI 40.60 kg/m  Patient's last menstrual period was 09/09/2019. Physical Exam Constitutional:      General: She is not in acute distress.    Appearance: Normal appearance. She is well-developed.  Genitourinary:     Pelvic exam was performed with patient in the lithotomy position.     Vulva, inguinal canal, urethra, bladder and vagina normal.     No posterior fourchette tenderness, injury or lesion present.     Cervical exam comments: Extremely retropubic. Able to visualize somewhat with no apparent friability or lesions.  Not palpable on bimanual.     Uterus is enlarged.     Uterus is not tender.     Uterus is anteverted.     Right and left adnexa are non-palpable.  HENT:     Head: Normocephalic and atraumatic.  Eyes:     General: No scleral icterus.    Conjunctiva/sclera: Conjunctivae normal.  Cardiovascular:     Rate and Rhythm: Normal rate and regular rhythm.     Heart sounds: No murmur heard.  No friction rub. No gallop.   Pulmonary:     Effort: Pulmonary effort is normal. No respiratory distress.     Breath sounds: Normal breath sounds. No wheezing or rales.  Abdominal:     General: Bowel sounds are normal. There is no distension.     Palpations: Abdomen is soft. There is mass (up to level of umb extending from pelvis).     Tenderness: There is no abdominal tenderness. There is no guarding or rebound.  Musculoskeletal:        General: Normal range of motion.     Cervical back: Normal range of motion and neck supple.  Neurological:     General: No focal deficit present.     Mental Status: She is alert and oriented to person, place, and time.     Cranial Nerves: No cranial nerve deficit.   Skin:    General: Skin is warm and dry.     Findings: No erythema.  Psychiatric:        Mood and Affect: Mood normal.        Behavior: Behavior normal.        Judgment: Judgment normal.     Female chaperone  present for pelvic and breast  portions of the physical exam  Assessment: 49 y.o. [id number] female here for  1. Cervical mass      Plan: Problem List Items Addressed This Visit    None    Visit Diagnoses    Cervical mass    -  Primary   Relevant Orders   Cytology - PAP     Pap smear today.  I discussed the findings from the ER and reviewed images (CT and ultrasound) with the patient. I discussed my concern regarding the nature of her cervical lesion. I recommend referral to gynecologic oncology for further workup and treatment.  She voiced understanding and agreement.  A total of 45 minutes were spent face-to-face with the patient as well as preparation, review, communication, and documentation during this encounter.    Prentice Docker, MD 09/19/2019 5:06 PM

## 2019-09-25 ENCOUNTER — Inpatient Hospital Stay: Payer: Self-pay | Attending: Obstetrics and Gynecology | Admitting: Obstetrics and Gynecology

## 2019-09-25 ENCOUNTER — Other Ambulatory Visit: Payer: Self-pay

## 2019-09-25 VITALS — BP 138/85 | HR 80 | Temp 97.8°F | Resp 20 | Wt 195.0 lb

## 2019-09-25 DIAGNOSIS — N920 Excessive and frequent menstruation with regular cycle: Secondary | ICD-10-CM

## 2019-09-25 DIAGNOSIS — D509 Iron deficiency anemia, unspecified: Secondary | ICD-10-CM | POA: Insufficient documentation

## 2019-09-25 DIAGNOSIS — N858 Other specified noninflammatory disorders of uterus: Secondary | ICD-10-CM

## 2019-09-25 DIAGNOSIS — N888 Other specified noninflammatory disorders of cervix uteri: Secondary | ICD-10-CM

## 2019-09-25 DIAGNOSIS — Z87891 Personal history of nicotine dependence: Secondary | ICD-10-CM | POA: Insufficient documentation

## 2019-09-25 HISTORY — DX: Excessive and frequent menstruation with regular cycle: N92.0

## 2019-09-25 HISTORY — DX: Other specified noninflammatory disorders of uterus: N85.8

## 2019-09-25 NOTE — Progress Notes (Signed)
[facility] [place]  [phone] [phone]  Patient Care Team: Ricardo Jericho, NP as PCP - General (Family Medicine) Clent Jacks, RN as Oncology Nurse Navigator   Name of the patient: Courtney Cruz  [id number]  06/23/1970   Date of visit: 09/25/2019  Diagnosis: Cervical mass/Uterine mass  Referring Provider: Dr. Glennon Mac  Subjective:  Courtney Cruz is a 49 y.o. female who is seen in consultation from Dr. Glennon Mac for cervical/uterine mass.   Presented to ER with abdominal pain and vaginal bleeding, severe, cramping lower midline and pelvic pain.  She still has normal but heavy periods with no bleeding in between. Takes iron supplements for anemia with low MCV.   Ultrasound 09/10/19 Uterus measuring 14.7 x 5.7 x 5.9 cm, fluid within endometrial cavity which communicates with a dilated fluid filled cervix. Heterogeneous, mixed cystic and solid mass within the cervix suspicious for primary cervical neoplasm. Mass measures at least 5.7 x 4.5 x 4.4 with some degree of internal degeneration demonstrating a fluid fluid level within the lower uterine segment/cervix. Appears to involve lower uterine segment but does not extend into parametrial soft tissues or vagina.  Endometrium- measuring 68mm in thickness Right ovary- not visualized Left Ovary- not visualized  CT abdomen pelvis w contrast 09/10/19 Heterogeneous hypodense mouth within the submucosal lower uterine segment extending into the cervical canal possibly representing a partially necrotic fibroid.  Small hyperdense area in the posterior left bladder which could represent hemorrhagic/proteinaceous debris.  No enlarged mesenteric retroperitoneal or pelvic lymph nodes.  Pelvic exam with Dr. Glennon Mac on 09/19/2019 revealed extremely retropubic cervix with no apparent friability or lesions.  Not palpable on bimanual.  Uterus enlarged, nontender, anteverted.   Right and left adnexa were nonpalpable.  Abdominal mass palpable up to level of umbilicus extending from pelvis.  Nontender.  Pap smear obtained.  Results pending.  Problem List: There are no problems to display for this patient.  Past Medical History: Past Medical History:  Diagnosis Date  . Anemia   . Uterine fibroid     Past Surgical History: Past Surgical History:  Procedure Laterality Date  . CESAREAN SECTION      Past Gynecologic History:  Menarche: 24. Lasts 3-4 days, Regular, Denies abnormal, Denies STDs, Sexually active  OB History:  OB History  Gravida Para Term Preterm AB Living  7 4     3 4   SAB TAB Ectopic Multiple Live Births               # Outcome Date GA Lbr Len/2nd Weight Sex Delivery Anes PTL Lv  7 AB           6 AB           5 AB           4 Para           3 Para           2 Para           1 Para            Family History: Family History  Problem Relation Age of Onset  . Diabetes Maternal Grandfather   . Hypertension Maternal Grandfather   . Cancer Neg Hx    Social History: Social History   Socioeconomic History  . Marital status: Married    Spouse name: Not on file  . Number of children: Not on file  . Years of education: Not on  file  . Highest education level: Not on file  Occupational History  . Not on file  Tobacco Use  . Smoking status: Former Research scientist (life sciences)  . Smokeless tobacco: Never Used  Vaping Use  . Vaping Use: Never used  Substance and Sexual Activity  . Alcohol use: Yes    Comment: Occ  . Drug use: Never  . Sexual activity: Yes    Birth control/protection: None  Other Topics Concern  . Not on file  Social History Narrative  . Not on file   Social Determinants of Health   Financial Resource Strain:   . Difficulty of Paying Living Expenses:   Food Insecurity:   . Worried About Charity fundraiser in the Last Year:   . Arboriculturist in the Last Year:   Transportation Needs:   . Film/video editor (Medical):    Marland Kitchen Lack of Transportation (Non-Medical):   Physical Activity:   . Days of Exercise per Week:   . Minutes of Exercise per Session:   Stress:   . Feeling of Stress :   Social Connections:   . Frequency of Communication with Friends and Family:   . Frequency of Social Gatherings with Friends and Family:   . Attends Religious Services:   . Active Member of Clubs or Organizations:   . Attends Archivist Meetings:   Marland Kitchen Marital Status:   Intimate Partner Violence:   . Fear of Current or Ex-Partner:   . Emotionally Abused:   Marland Kitchen Physically Abused:   . Sexually Abused:    Allergies: Allergies  Allergen Reactions  . Sumatriptan Anaphylaxis and Shortness Of Breath  . Latex Rash   Current Medications: Current Outpatient Medications  Medication Sig Dispense Refill  . ferrous sulfate 325 (65 FE) MG EC tablet Take 1 tablet by mouth every morning.    Marland Kitchen ibuprofen (ADVIL) 600 MG tablet Take 1 tablet (600 mg total) by mouth every 8 (eight) hours as needed for mild pain. 20 tablet 0  . ondansetron (ZOFRAN ODT) 4 MG disintegrating tablet Take 1 tablet (4 mg total) by mouth every 8 (eight) hours as needed for nausea or vomiting. 20 tablet 0  . oxyCODONE-acetaminophen (PERCOCET) 5-325 MG tablet Take 1-2 tablets by mouth every 6 (six) hours as needed for moderate pain or severe pain. 20 tablet 0   No current facility-administered medications for this visit.   ROS General: negative for fevers, changes in weight or night sweats Skin: negative for changes in moles or sores or rash Eyes: negative for changes in vision HEENT: negative for change in hearing, tinnitus, voice changes Pulmonary: negative for dyspnea, orthopnea, productive cough, wheezing Cardiac: negative for palpitations, pain Gastrointestinal: negative for nausea, vomiting, constipation, diarrhea, hematemesis, hematochezia Genitourinary/Sexual: negative for dysuria, retention, hematuria, incontinence Ob/Gyn:  negative for  abnormal bleeding, or pain Musculoskeletal: negative for pain, joint pain, back pain Hematology: negative for easy bruising, abnormal bleeding Neurologic/Psych: negative for seizures, paralysis, weakness, numbness. Positive for headaches   Objective:  Physical Examination:  BP (!) 138/85   Pulse 80   Temp 97.8 F (36.6 C)   Resp 20   Wt 195 lb (88.5 kg)   LMP 09/09/2019   SpO2 100%   BMI 39.39 kg/m      ECOG Performance Status: 1 - Symptomatic but completely ambulatory  GENERAL: Patient is a well appearing female in no acute distress HEENT:  PERRL, neck supple with midline trachea. Thyroid without masses.  NODES:  No cervical, supraclavicular,  axillary, or inguinal lymphadenopathy palpated.  LUNGS:  Clear to auscultation bilaterally.  No wheezes or rhonchi. HEART:  Regular rate and rhythm. No murmur appreciated. ABDOMEN:  Soft, nontender.  Positive, normoactive bowel sounds. Mass palpable at umbilicus.  Old vertical incision well healed.  MSK:  No focal spinal tenderness to palpation. Full range of motion bilaterally in the upper extremities. EXTREMITIES:  No peripheral edema.   SKIN:  Clear with no obvious rashes or skin changes. No nail dyscrasia. NEURO:  Nonfocal. Well oriented.  Appropriate affect.  Pelvic: EGBUS: no lesions Cervix: not palpable, as it is deviated under pubic bone Vagina: no lesions, no discharge or bleeding Uterus: enlarged to umbilicus and up out of pelvis Adnexa: no palpable masses Rectovaginal: confirmatory  Lab Review Lab Results  Component Value Date   WBC 8.3 09/10/2019   HGB 12.0 09/10/2019   HCT 37.1 09/10/2019   MCV 75.3 (L) 09/10/2019   PLT 301 09/10/2019     Chemistry      Component Value Date/Time   NA 138 09/10/2019 0955   NA 137 05/28/2014 2307   K 3.7 09/10/2019 0955   K 3.7 05/28/2014 2307   CL 108 09/10/2019 0955   CL 107 05/28/2014 2307   CO2 24 09/10/2019 0955   CO2 25 05/28/2014 2307   BUN 11 09/10/2019 0955   BUN  10 05/28/2014 2307   CREATININE 0.88 09/10/2019 0955   CREATININE 0.80 05/28/2014 2307      Component Value Date/Time   CALCIUM 8.7 (L) 09/10/2019 0955   CALCIUM 8.7 (L) 05/28/2014 2307   ALKPHOS 66 09/10/2019 0955   ALKPHOS 74 12/14/2012 2033   AST 21 09/10/2019 0955   AST 23 12/14/2012 2033   ALT 17 09/10/2019 0955   ALT 16 12/14/2012 2033   BILITOT 0.6 09/10/2019 0955   BILITOT 0.2 12/14/2012 2033       Radiologic Imaging: As per HPI    Assessment:  Courtney Cruz is a 49 y.o. P4 female diagnosed with heterogeneous, mixed cystic and solid mass within the cervix and lower uterine suspicious for primary cervical neoplasm, possible degenerating fibroid.  Menorrhagia, but no bleeding between periods. Endometrial biopsy not technically possible.  Most likely benign fibroid, but presentation a bit unusual and unable to do endometrial sampling so cannot rule out cancer.  Do not think this is a primary cervical cancer. Fertility not an issue.     Medical co-morbidities complicating care: iron deficiency anemia treated with iron, prior C section.  Plan:   Problem List Items Addressed This Visit      Other   Menorrhagia   Uterine mass    Other Visit Diagnoses    Cervical mass    -  Primary     Do not think watchful waiting is advisable.  We discussed options for management including total hysterectomy, and bilateral salpingectomy if benign.  Also discussed that surgery should be done open in view of large uterine/cervical mass and prior vertical incision.  Will also consent for possible,BSO, pelvic/aortic nodes, omentectomy if frozen shows cancer.  Surgery scheduled for 8/18 with Drs. Alethea Terhaar and [employer].   The risks of surgery were discussed in detail and she understands these to include infection; wound separation; hernia; vaginal cuff separation, injury to adjacent organs such as bowel, bladder, blood vessels, ureters and nerves; bleeding which may require blood transfusion;  anesthesia risk; thromboembolic events; possible death; unforeseen complications; possible need for re-exploration; medical complications such as heart attack, stroke, pleural  effusion and pneumonia; and, if staging performed the risk of lymphedema and lymphocyst.  The patient will receive DVT and antibiotic prophylaxis as indicated.  She voiced a clear understanding.  She had the opportunity to ask questions and written informed consent was obtained today.  Routine labs ordered.   Suggested return to clinic post op.    The patient's diagnosis, an outline of the further diagnostic and laboratory studies which will be required, the recommendation for surgery, and alternatives were discussed with her.  All questions were answered to their satisfaction.  A total of 40 minutes were spent with the patient/family today; 40% was spent in education, counseling and coordination of care for uterine mass and menorrhagia.    Courtney Au, NP  CC:  Ricardo Jericho, [address] [place],  [place] [id number] [phone]

## 2019-09-25 NOTE — Patient Instructions (Signed)
DIVISION OF GYNECOLOGIC ONCOLOGY BOWEL PREP   The following instructions are extremely important to prepare for your surgery. Please follow them carefully   Step 1: Liquid Diet Instructions   The day before surgery, drink ONLY CLEAR LIQUIDS for breakfast, lunch, dinner and throughout the day.  Drink at least 64 oz of fluid.             CLEAR LIQUID EXAMPLES:             Beef, chicken or vegetable broth, sodas, coffee, tea (sugar, lemon             artificial sweeteners, honey are acceptable), juices (apple, grape, cranberry, any    mixture of clear juices). Kool-Aid, Gatorade, Jell-o (without fruit), popsicles                          NO MILK, MILK PRODUCTS, NON-DAIRY CREAMERS    Step 2: Laxatives           The evening before surgery:   Time: around 5pm   Follow these instructions carefully.   Administer 1 Dulcolax suppository according to manufacturer instructions on the box. You will need to purchase this laxative at a pharmacy or grocery store.     Individual responses to laxatives vary; this prep may cause multiple bowel movements. It often works in 30 minutes and may take as long as 3 hours. Stay near an available bathroom.    It is important to stay hydrated. Ensure you are still drinking clear liquids.      IMPORTANT: FOR YOUR SAFETY, WE WILL HAVE TO CANCEL YOUR SURGERY IF YOU DO NOT FOLLOW THESE INSTRUCTIONS.    Do not eat anything after midnight (including gum or candy) prior to your surgery.  Avoid drinking carbonated beverages after midnight.  You can have clear liquids up until one hour before you arrive at the hospital. "Nothing by mouth" means no liquids, gum, candy, etc for one hour before your arrival time.  Exploratory Laparotomy, Adult Exploratory laparotomy is a surgical procedure to examine the organs inside your belly (abdomen). Another name for this is abdominal exploration. You may have this procedure if you have abdominal pain, trauma, bleeding, infection,  or obstruction. The procedure may be done if your health care provider cannot make a diagnosis from only an exam and testing. Exploratory laparotomy may be a planned procedure or an emergency procedure. You may have surgical treatment as part of the laparotomy, or you may have additional treatment after your laparotomy. This will depend on what your surgeon finds during the procedure. Tell a health care provider about:  Any allergies you have.  All medicines you are taking, including vitamins, herbs, eye drops, creams, and over-the-counter medicines.  Any problems you or family members have had with anesthetic medicines.  Any blood disorders you have.  Any surgeries you have had.  Any medical conditions you have. What are the risks? Generally, this is a safe procedure. However, problems can occur and include:  Bleeding.  Infection.  A blood clot that forms in your leg and travels to your lungs.  Damage to organs inside your abdomen.  Scar tissue that blocks your digestive tract.  What happens before the procedure?  Ask your health care provider about: ? Changing or stopping your regular medicines. This is especially important if you are taking diabetes medicines or blood thinners. ? Taking medicines such as aspirin and ibuprofen. These medicines can thin your blood.  Do not take these medicines before your procedure if your health care provider instructs you not to.  Do not eat or drink anything after midnight on the night before the procedure or as directed by your health care provider.  You may be given instructions for clearing out your bowel before surgery (bowel prep). If you are already in the hospital, the bowel prep may be done there. What happens during the procedure?  An IV tube may be inserted into a vein. You may receive fluids and medicine through the IV tube. This may include antibiotic medicine to treat or prevent infection.  You will be given a medicine that  makes you go to sleep (general anesthetic).  You may have a tube placed through your nose and into your stomach (nasogastric tube) to drain your stomach fluids.  You may have a tube placed into your bladder (urinary catheter) to drain urine.  Your abdomen will be cleaned with a germ-killing solution (antiseptic).  The surgeon will make a surgical cut (incision) in your abdomen. This is usually an up-and-down incision in the midsection of your abdomen. The incision will go through the inside lining of your abdomen (peritoneum).  Your surgeon will spread the incision wide enough to examine the inside of your abdomen.  The rest of the procedure will depend on what the surgeon finds: ? The surgeon will check all organs in your abdomen for damage or obstruction. Repairs will be made when possible. ? If there is blood in the abdomen, the surgeon will look for the source of the bleeding in order to stop it. ? If there is yellowish-white fluid (pus) or gastric fluids in your abdomen, the surgeon will check for an infection or a hole (perforation) in your digestive tract. ? If the surgeon finds infection, a drain may be placed to empty fluid that can build up in your abdomen after surgery. ? If there is a growth (tumor) inside your abdomen, the surgeon may remove a piece of the growth (biopsy) to examine it under a microscope.  When all procedures are complete, the surgeon will close your abdomen with layers of stitches (sutures).  The incision through the skin of your abdomen will be closed with sutures or staples. What happens after the procedure?  Your blood pressure, heart rate, breathing rate, and blood oxygen level will be monitored often until the medicines you were given have worn off.  You will continue to receive fluids and nutrition through your IV tube. This will stop when you can eat and drink on your own.  You may also get antibiotic medicine and pain medicine through your IV  tube.  Your nasogastric tube may be removed when you start to pass gas.  Your urinary catheter may be removed when the anesthetic wears off. This information is not intended to replace advice given to you by your health care provider. Make sure you discuss any questions you have with your health care provider. Document Released: 11/09/2000 Document Revised: 07/23/2015 Document Reviewed: 10/02/2013 Elsevier Interactive Patient Education  2018 [employer].   Hysterectomy Information A hysterectomy is a surgery to remove your uterus. After surgery, you will no longer have periods. Also, you will not be able to get pregnant. Reasons for this surgery  You have bleeding that is not normal and keeps coming back.  You have lasting (chronic) lower belly (pelvic) pain.  You have a lasting infection.  The lining of your uterus grows outside your uterus.  The lining  of your uterus grows in the muscle of your uterus.  Your uterus falls down into your vagina.  You have a growth in your uterus that causes problems.  You have cells that could turn into cancer (precancerous cells).  You have cancer of the uterus or cervix. Types There are 3 types of hysterectomies. Depending on the type, the surgery will:  Remove the top part of the uterus only.  Remove the uterus and the cervix.  Remove the uterus, cervix, and tissue that holds the uterus in place in the lower belly.  Ways a hysterectomy can be performed There are 5 ways this surgery can be performed.  A cut (incision) is made in the belly (abdomen). The uterus is taken out through the cut.  A cut is made in the vagina. The uterus is taken out through the cut.  Three or four cuts are made in the belly. A surgical device with a camera is put through one of the cuts. The uterus is cut into small pieces. The uterus is taken out through the cuts or the vagina.  Three or four cuts are made in the belly. A surgical device with a camera  is put through one of the cuts. The uterus is taken out through the vagina.  Three or four cuts are made in the belly. A surgical device that is controlled by a computer makes a visual image. The device helps the surgeon control the surgical tools. The uterus is cut into small pieces. The pieces are taken out through the cuts or through the vagina.  What can I expect after the surgery?  You will be given pain medicine.  You will need help at home for 3-5 days after surgery.  You will need to see your doctor in 2-4 weeks after surgery.  You may get hot flashes, have night sweats, and have trouble sleeping.  You may need to have Pap tests in the future if your surgery was related to cancer. Talk to your doctor. It is still good to have regular exams. This information is not intended to replace advice given to you by your health care provider. Make sure you discuss any questions you have with your health care provider. Document Released: 05/09/2011 Document Revised: 07/23/2015 Document Reviewed: 10/22/2012 Elsevier Interactive Patient Education  2018 [employer].  Exploratory Laparotomy, Adult, Care After Refer to this sheet in the next few weeks. These instructions provide you with information about caring for yourself after your procedure. Your health care provider may also give you more specific instructions. Your treatment has been planned according to current medical practices, but problems sometimes occur. Call your health care provider if you have any problems or questions after your procedure. What can I expect after the procedure? After your procedure, it is typical to have:  Abdominal soreness.  Fatigue.  A sore throat from tubes in your throat.  A lack of appetite.  Follow these instructions at home: Medicines  Take medicines only as directed by your health care provider.  Do not drive or operate heavy machinery while taking pain medicine. Incision care  There are  many different ways to close and cover an incision, including stitches (sutures), skin glue, and adhesive strips. Follow your health care provider's instructions about: ? Incision care. ? Bandage (dressing) changes and removal. ? Incision closure removal.  Do not take showers or baths until your health care provider says that you can.  Check your incision area daily for signs of infection.  Watch for: ? Redness. ? Tenderness. ? Swelling. ? Drainage. Activity  Do not lift anything that is heavier than 10 pounds (4.5 kg) until your health care provider says that it is safe.  Try to walk a little bit each day if your health care provider says that it is okay.  Ask your health care provider when you can start to do your usual activities again, such as driving, going back to work, and having sex. Eating and drinking  You may eat what you usually eat. Include lots of whole grains, fruits, and vegetables in your diet. This will help to prevent constipation.  Drink enough fluid to keep your urine clear or pale yellow. General instructions  Keep all follow-up visits as directed by your health care provider. This is important. Contact a health care provider if:  You have a fever.  You have chills.  Your pain medicine is not helping.  You have constipation or diarrhea.  You have nausea or vomiting.  You have drainage, redness, swelling, or pain at your incision site. Get help right away if:  Your pain is getting worse.  It has been more than 3 days since you been able to have a bowel movement.  You have ongoing (persistent) vomiting.  The edges of your incision open up.  You have warmth, tenderness, and swelling in your calf.  You have trouble breathing.  You have chest pain. This information is not intended to replace advice given to you by your health care provider. Make sure you discuss any questions you have with your health care provider. Document Released: 09/29/2003  Document Revised: 07/23/2015 Document Reviewed: 10/02/2013 Elsevier Interactive Patient Education  2018 [employer].     Abdominal Hysterectomy, Care After This sheet gives you information about how to care for yourself after your procedure. Your doctor may also give you more specific instructions. If you have problems or questions, contact your doctor. Follow these instructions at home: Bathing  Do not take baths, swim, or use a hot tub until your doctor says it is okay. Ask your doctor if you can take showers. You may only be allowed to take sponge baths for bathing.  Keep the bandage (dressing) dry until your doctor says it can be taken off. Surgical cut ( incision) care  Follow instructions from your doctor about how to take care of your cut from surgery. Make sure you: ? Wash your hands with soap and water before you change your bandage (dressing). If you cannot use soap and water, use hand sanitizer. ? Change your bandage as told by your doctor. ? Leave stitches (sutures), skin glue, or skin tape (adhesive) strips in place. They may need to stay in place for 2 weeks or longer. If tape strips get loose and curl up, you may trim the loose edges. Do not remove tape strips completely unless your doctor says it is okay.  Check your surgical cut area every day for signs of infection. Check for: ? Redness, swelling, or pain. ? Fluid or blood. ? Warmth. ? Pus or a bad smell. Activity  Do gentle, daily exercise as told by your doctor. You may be told to take short walks every day and go farther each time.  Do not lift anything that is heavier than 10 lb (4.5 kg), or the limit that your doctor tells you, until he or she says that it is safe.  Do not drive or use heavy machinery while taking prescription pain medicine.  Do  not drive for 24 hours if you were given a medicine to help you relax (sedative).  Follow your doctor's advice about exercise, driving, and general activities. Ask  your doctor what activities are safe for you. Lifestyle  Do not douche, use tampons, or have sex for at least 6 weeks or as told by your doctor.  Do not drink alcohol until your doctor says it is okay.  Drink enough fluid to keep your pee (urine) clear or pale yellow.  Try to have someone at home with you for the first 1-2 weeks to help.  Do not use any products that contain nicotine or tobacco, such as cigarettes and e-cigarettes. These can slow down healing. If you need help quitting, ask your doctor. General instructions  Take over-the-counter and prescription medicines only as told by your doctor.  Do not take aspirin or ibuprofen. These medicines can cause bleeding.  To prevent or treat constipation while you are taking prescription pain medicine, your doctor may suggest that you: ? Drink enough fluid to keep your urine clear or pale yellow. ? Take over-the-counter or prescription medicines. ? Eat foods that are high in fiber, such as:  Fresh fruits and vegetables.  Whole grains.  Beans. ? Limit foods that are high in fat and processed sugars, such as fried and sweet foods.  Keep all follow-up visits as told by your doctor. This is important. Contact a doctor if:  You have chills or fever.  You have redness, swelling, or pain around your cut.  You have fluid or blood coming from your cut.  Your cut feels warm to the touch.  You have pus or a bad smell coming from your cut.  Your cut breaks open.  You feel dizzy or light-headed.  You have pain or bleeding when you pee.  You keep having watery poop (diarrhea).  You keep feeling sick to your stomach (nauseous) or keep throwing up (vomiting).  You have unusual fluid (discharge) coming from your vagina.  You have a rash.  You have a reaction to your medicine.  Your pain medicine does not help. Get help right away if:  You have a fever and your symptoms get worse all of a sudden.  You have very bad belly  (abdominal) pain.  You are short of breath.  You pass out (faint).  You have pain, swelling, or redness of your leg.  You bleed a lot from your vagina and notice clumps of blood (clots). Summary  Do not take baths, swim, or use a hot tub until your doctor says it is okay. Ask your doctor if you can take showers. You may only be allowed to take sponge baths for bathing.  Follow your doctor's advice about exercise, driving, and general activities. Ask your doctor what activities are safe for you.  Do not lift anything that is heavier than 10 lb (4.5 kg), or the limit that your doctor tells you, until he or she says that it is safe.  Try to have someone at home with you for the first 1-2 weeks to help. This information is not intended to replace advice given to you by your health care provider. Make sure you discuss any questions you have with your health care provider. Document Released: 11/24/2007 Document Revised: 02/03/2016 Document Reviewed: 02/03/2016 Elsevier Interactive Patient Education  2017 [employer].

## 2019-09-25 NOTE — H&P (View-Only) (Signed)
Gynecology [place]  Telephone:([phone] [phone]  Patient Care Team: Ricardo Jericho, NP as PCP - General (Family Medicine) Clent Jacks, RN as Oncology Nurse Navigator   Name of the patient: Courtney Cruz  [id number]  Mar 01, 1970   Date of visit: 09/25/2019  Diagnosis: Cervical mass/Uterine mass  Referring Provider: Dr. Glennon Mac  Subjective:  ABBYGALE LAPID is a 49 y.o. female who is seen in consultation from Dr. Glennon Mac for cervical/uterine mass.   Presented to ER with abdominal pain and vaginal bleeding, severe, cramping lower midline and pelvic pain.  She still has normal but heavy periods with no bleeding in between. Takes iron supplements for anemia with low MCV.   Ultrasound 09/10/19 Uterus measuring 14.7 x 5.7 x 5.9 cm, fluid within endometrial cavity which communicates with a dilated fluid filled cervix. Heterogeneous, mixed cystic and solid mass within the cervix suspicious for primary cervical neoplasm. Mass measures at least 5.7 x 4.5 x 4.4 with some degree of internal degeneration demonstrating a fluid fluid level within the lower uterine segment/cervix. Appears to involve lower uterine segment but does not extend into parametrial soft tissues or vagina.  Endometrium- measuring 64mm in thickness Right ovary- not visualized Left Ovary- not visualized  CT abdomen pelvis w contrast 09/10/19 Heterogeneous hypodense mouth within the submucosal lower uterine segment extending into the cervical canal possibly representing a partially necrotic fibroid.  Small hyperdense area in the posterior left bladder which could represent hemorrhagic/proteinaceous debris.  No enlarged mesenteric retroperitoneal or pelvic lymph nodes.  Pelvic exam with Dr. Glennon Mac on 09/19/2019 revealed extremely retropubic cervix with no apparent friability or lesions.  Not palpable on bimanual.  Uterus enlarged, nontender, anteverted.   Right and left adnexa were nonpalpable.  Abdominal mass palpable up to level of umbilicus extending from pelvis.  Nontender.  Pap smear obtained.  Results pending.  Problem List: There are no problems to display for this patient.  Past Medical History: Past Medical History:  Diagnosis Date  . Anemia   . Uterine fibroid     Past Surgical History: Past Surgical History:  Procedure Laterality Date  . CESAREAN SECTION      Past Gynecologic History:  Menarche: 24. Lasts 3-4 days, Regular, Denies abnormal, Denies STDs, Sexually active  OB History:  OB History  Gravida Para Term Preterm AB Living  7 4     3 4   SAB TAB Ectopic Multiple Live Births               # Outcome Date GA Lbr Len/2nd Weight Sex Delivery Anes PTL Lv  7 AB           6 AB           5 AB           4 Para           3 Para           2 Para           1 Para            Family History: Family History  Problem Relation Age of Onset  . Diabetes Maternal Grandfather   . Hypertension Maternal Grandfather   . Cancer Neg Hx    Social History: Social History   Socioeconomic History  . Marital status: Married    Spouse name: Not on file  . Number of children: Not on file  . Years of education: Not on  file  . Highest education level: Not on file  Occupational History  . Not on file  Tobacco Use  . Smoking status: Former Research scientist (life sciences)  . Smokeless tobacco: Never Used  Vaping Use  . Vaping Use: Never used  Substance and Sexual Activity  . Alcohol use: Yes    Comment: Occ  . Drug use: Never  . Sexual activity: Yes    Birth control/protection: None  Other Topics Concern  . Not on file  Social History Narrative  . Not on file   Social Determinants of Health   Financial Resource Strain:   . Difficulty of Paying Living Expenses:   Food Insecurity:   . Worried About Charity fundraiser in the Last Year:   . Arboriculturist in the Last Year:   Transportation Needs:   . Film/video editor (Medical):     Marland Kitchen Lack of Transportation (Non-Medical):   Physical Activity:   . Days of Exercise per Week:   . Minutes of Exercise per Session:   Stress:   . Feeling of Stress :   Social Connections:   . Frequency of Communication with Friends and Family:   . Frequency of Social Gatherings with Friends and Family:   . Attends Religious Services:   . Active Member of Clubs or Organizations:   . Attends Archivist Meetings:   Marland Kitchen Marital Status:   Intimate Partner Violence:   . Fear of Current or Ex-Partner:   . Emotionally Abused:   Marland Kitchen Physically Abused:   . Sexually Abused:    Allergies: Allergies  Allergen Reactions  . Sumatriptan Anaphylaxis and Shortness Of Breath  . Latex Rash   Current Medications: Current Outpatient Medications  Medication Sig Dispense Refill  . ferrous sulfate 325 (65 FE) MG EC tablet Take 1 tablet by mouth every morning.    Marland Kitchen ibuprofen (ADVIL) 600 MG tablet Take 1 tablet (600 mg total) by mouth every 8 (eight) hours as needed for mild pain. 20 tablet 0  . ondansetron (ZOFRAN ODT) 4 MG disintegrating tablet Take 1 tablet (4 mg total) by mouth every 8 (eight) hours as needed for nausea or vomiting. 20 tablet 0  . oxyCODONE-acetaminophen (PERCOCET) 5-325 MG tablet Take 1-2 tablets by mouth every 6 (six) hours as needed for moderate pain or severe pain. 20 tablet 0   No current facility-administered medications for this visit.   ROS General: negative for fevers, changes in weight or night sweats Skin: negative for changes in moles or sores or rash Eyes: negative for changes in vision HEENT: negative for change in hearing, tinnitus, voice changes Pulmonary: negative for dyspnea, orthopnea, productive cough, wheezing Cardiac: negative for palpitations, pain Gastrointestinal: negative for nausea, vomiting, constipation, diarrhea, hematemesis, hematochezia Genitourinary/Sexual: negative for dysuria, retention, hematuria, incontinence Ob/Gyn:  negative for  abnormal bleeding, or pain Musculoskeletal: negative for pain, joint pain, back pain Hematology: negative for easy bruising, abnormal bleeding Neurologic/Psych: negative for seizures, paralysis, weakness, numbness. Positive for headaches   Objective:  Physical Examination:  BP (!) 138/85   Pulse 80   Temp 97.8 F (36.6 C)   Resp 20   Wt 195 lb (88.5 kg)   LMP 09/09/2019   SpO2 100%   BMI 39.39 kg/m      ECOG Performance Status: 1 - Symptomatic but completely ambulatory  GENERAL: Patient is a well appearing female in no acute distress HEENT:  PERRL, neck supple with midline trachea. Thyroid without masses.  NODES:  No cervical,  supraclavicular, axillary, or inguinal lymphadenopathy palpated.  LUNGS:  Clear to auscultation bilaterally.  No wheezes or rhonchi. HEART:  Regular rate and rhythm. No murmur appreciated. ABDOMEN:  Soft, nontender.  Positive, normoactive bowel sounds. Mass palpable at umbilicus.  Old vertical incision well healed.  MSK:  No focal spinal tenderness to palpation. Full range of motion bilaterally in the upper extremities. EXTREMITIES:  No peripheral edema.   SKIN:  Clear with no obvious rashes or skin changes. No nail dyscrasia. NEURO:  Nonfocal. Well oriented.  Appropriate affect.  Pelvic: EGBUS: no lesions Cervix: not palpable, as it is deviated under pubic bone Vagina: no lesions, no discharge or bleeding Uterus: enlarged to umbilicus and up out of pelvis Adnexa: no palpable masses Rectovaginal: confirmatory  Lab Review Lab Results  Component Value Date   WBC 8.3 09/10/2019   HGB 12.0 09/10/2019   HCT 37.1 09/10/2019   MCV 75.3 (L) 09/10/2019   PLT 301 09/10/2019     Chemistry      Component Value Date/Time   NA 138 09/10/2019 0955   NA 137 05/28/2014 2307   K 3.7 09/10/2019 0955   K 3.7 05/28/2014 2307   CL 108 09/10/2019 0955   CL 107 05/28/2014 2307   CO2 24 09/10/2019 0955   CO2 25 05/28/2014 2307   BUN 11 09/10/2019 0955   BUN  10 05/28/2014 2307   CREATININE 0.88 09/10/2019 0955   CREATININE 0.80 05/28/2014 2307      Component Value Date/Time   CALCIUM 8.7 (L) 09/10/2019 0955   CALCIUM 8.7 (L) 05/28/2014 2307   ALKPHOS 66 09/10/2019 0955   ALKPHOS 74 12/14/2012 2033   AST 21 09/10/2019 0955   AST 23 12/14/2012 2033   ALT 17 09/10/2019 0955   ALT 16 12/14/2012 2033   BILITOT 0.6 09/10/2019 0955   BILITOT 0.2 12/14/2012 2033       Radiologic Imaging: As per HPI    Assessment:  ANIELLA WANDREY is a 49 y.o. P4 female diagnosed with heterogeneous, mixed cystic and solid mass within the cervix and lower uterine suspicious for primary cervical neoplasm, possible degenerating fibroid.  Menorrhagia, but no bleeding between periods. Endometrial biopsy not technically possible.  Most likely benign fibroid, but presentation a bit unusual and unable to do endometrial sampling so cannot rule out cancer.  Do not think this is a primary cervical cancer. Fertility not an issue.     Medical co-morbidities complicating care: iron deficiency anemia treated with iron, prior C section.  Plan:   Problem List Items Addressed This Visit      Other   Menorrhagia   Uterine mass    Other Visit Diagnoses    Cervical mass    -  Primary     Do not think watchful waiting is advisable.  We discussed options for management including total hysterectomy, and bilateral salpingectomy if benign.  Also discussed that surgery should be done open in view of large uterine/cervical mass and prior vertical incision.  Will also consent for possible,BSO, pelvic/aortic nodes, omentectomy if frozen shows cancer.  Surgery scheduled for 8/18 with Drs. Ermal Haberer and [employer].   The risks of surgery were discussed in detail and she understands these to include infection; wound separation; hernia; vaginal cuff separation, injury to adjacent organs such as bowel, bladder, blood vessels, ureters and nerves; bleeding which may require blood transfusion;  anesthesia risk; thromboembolic events; possible death; unforeseen complications; possible need for re-exploration; medical complications such as heart attack, stroke,  pleural effusion and pneumonia; and, if staging performed the risk of lymphedema and lymphocyst.  The patient will receive DVT and antibiotic prophylaxis as indicated.  She voiced a clear understanding.  She had the opportunity to ask questions and written informed consent was obtained today.  Routine labs ordered.   Suggested return to clinic post op.    The patient's diagnosis, an outline of the further diagnostic and laboratory studies which will be required, the recommendation for surgery, and alternatives were discussed with her.  All questions were answered to their satisfaction.  A total of 40 minutes were spent with the patient/family today; 40% was spent in education, counseling and coordination of care for uterine mass and menorrhagia.    Verlon Au, NP  CC:  Ricardo Jericho, [address] [place],  [place] [address] [phone]

## 2019-09-26 ENCOUNTER — Telehealth: Payer: Self-pay

## 2019-09-26 NOTE — Telephone Encounter (Signed)
Called and notified with appointment details for PAT/COVID testing.

## 2019-09-27 LAB — CYTOLOGY - PAP
Adequacy: ABSENT
Comment: NEGATIVE
Comment: NEGATIVE
Diagnosis: NEGATIVE
HPV 16: NEGATIVE
HPV 18 / 45: NEGATIVE
High risk HPV: POSITIVE — AB

## 2019-10-09 ENCOUNTER — Other Ambulatory Visit: Payer: Self-pay

## 2019-10-09 ENCOUNTER — Encounter
Admission: RE | Admit: 2019-10-09 | Discharge: 2019-10-09 | Disposition: A | Discharge status: Home / Self Care | Payer: Self-pay | Source: Ambulatory Visit | Attending: Obstetrics and Gynecology | Admitting: Obstetrics and Gynecology

## 2019-10-09 DIAGNOSIS — Z01812 Encounter for preprocedural laboratory examination: Secondary | ICD-10-CM | POA: Insufficient documentation

## 2019-10-09 HISTORY — DX: Headache, unspecified: R51.9

## 2019-10-09 HISTORY — DX: Gastro-esophageal reflux disease without esophagitis: K21.9

## 2019-10-09 NOTE — Patient Instructions (Addendum)
Your procedure is scheduled on: Wednesday 10/16/19.  Report to DAY SURGERY DEPARTMENT LOCATED ON 2ND FLOOR MEDICAL MALL ENTRANCE. To find out your arrival time please call [phone] between 1PM - 3PM on Tuesday 10/15/19.   Remember: Instructions that are not followed completely may result in serious medical risk, up to and including death, or upon the discretion of your surgeon and anesthesiologist your surgery may need to be rescheduled.     __X__ 1. Step 1: Liquid Diet Instructions  The day before surgery, drink ONLY CLEAR LIQUIDSfor breakfast, lunch, dinner and throughout the day. Drink at least 64 oz of fluid. CLEAR LIQUID EXAMPLES: Beef, chicken or vegetable broth, sodas, coffee, tea (sugar, lemon, artificialsweeteners, honey are acceptable), juices (apple, grape, cranberry, any mixture of clear juices). Kool-Aid, Gatorade, Jell-o (without fruit), popsicles  NO MILK, MILK PRODUCTS, NON-DAIRY CREAMERS    Step 2: Fleets Enema  The evening before surgery: Time: around 5pm  Follow these instructions carefully.  It is important to stay hydrated. Ensure you are still drinking clear liquids.    __X__2.  On the morning of surgery brush your teeth with toothpaste and water, you may rinse your mouth with mouthwash if you wish.  Do not swallow any toothpaste or mouthwash.    __X__ 3.  No Alcohol for 24 hours before or after surgery.  __X__ 4.  Do Not Smoke or use e-cigarettes For 24 Hours Prior to Your Surgery.                 Do not use any chewable tobacco products for at least 6 hours prior to                 surgery.  __X__5.  Notify your doctor if there is any change in your medical condition      (cold, fever, infections).      Do NOT wear jewelry, make-up, hairpins, clips or nail polish. Do NOT wear lotions, powders, or perfumes.  Do NOT shave 48 hours prior to surgery. Men may shave face and neck. Do NOT  bring valuables to the hospital.     [facility] is not responsible for any belongings or valuables.   Contacts, dentures/partials or body piercings may not be worn into surgery. Bring a case for your contacts, glasses or hearing aids, a denture cup will be supplied. Leave your suitcase in the car. After surgery it may be brought to your room.   For patients admitted to the hospital, discharge time is determined by your treatment team.    Patients discharged the day of surgery will not be allowed to drive home.     __X__ Take these medicines the morning of surgery with A SIP OF WATER:     1. None    __X__ Fleet Enema (as directed)   __X__ Use CHG Soap/SAGE wipes as directed  __X__ Use inhalers on the day of surgery. Also bring the inhaler with you to the hospital on the morning of surgery.  __X__ Stop Anti-inflammatories 7 days before surgery such as Advil, Ibuprofen, Motrin, BC or Goodies Powder, Naprosyn, Naproxen, Aleve, Aspirin, Meloxicam. May take Tylenol or Oxycodone if needed for pain or discomfort.   __X__Do not start taking any new herbal supplements or vitamins prior to your procedure.     Wear comfortable clothing (specific to your surgery type) to the hospital.  Plan for stool softeners for home use; pain medications have a tendency to cause constipation. You can also help prevent  constipation by eating foods high in fiber such as fruits and vegetables and drinking plenty of fluids as your diet allows.  After surgery, you can prevent lung complications by doing breathing exercises.Take deep breaths and cough every 1-2 hours. Your doctor may order a device called an Incentive Spirometer to help you take deep breaths.  Please call the [facility] Department at [phone] if you have any questions about these instructions.

## 2019-10-10 ENCOUNTER — Encounter: Payer: Self-pay | Admitting: Obstetrics and Gynecology

## 2019-10-14 ENCOUNTER — Other Ambulatory Visit: Payer: Self-pay

## 2019-10-14 ENCOUNTER — Other Ambulatory Visit
Admission: RE | Admit: 2019-10-14 | Discharge: 2019-10-14 | Disposition: A | Discharge status: Home / Self Care | Payer: Self-pay | Source: Ambulatory Visit | Attending: Obstetrics and Gynecology | Admitting: Obstetrics and Gynecology

## 2019-10-14 DIAGNOSIS — Z01812 Encounter for preprocedural laboratory examination: Secondary | ICD-10-CM | POA: Insufficient documentation

## 2019-10-14 DIAGNOSIS — Z20822 Contact with and (suspected) exposure to covid-19: Secondary | ICD-10-CM | POA: Insufficient documentation

## 2019-10-14 LAB — CBC WITH DIFFERENTIAL/PLATELET
Abs Immature Granulocytes: 0.03 10*3/uL (ref 0.00–0.07)
Basophils Absolute: 0.1 10*3/uL (ref 0.0–0.1)
Basophils Relative: 1 %
Eosinophils Absolute: 0.2 10*3/uL (ref 0.0–0.5)
Eosinophils Relative: 2 %
HCT: 35.8 % — ABNORMAL LOW (ref 36.0–46.0)
Hemoglobin: 11.7 g/dL — ABNORMAL LOW (ref 12.0–15.0)
Immature Granulocytes: 0 %
Lymphocytes Relative: 24 %
Lymphs Abs: 2.1 10*3/uL (ref 0.7–4.0)
MCH: 24.4 pg — ABNORMAL LOW (ref 26.0–34.0)
MCHC: 32.7 g/dL (ref 30.0–36.0)
MCV: 74.6 fL — ABNORMAL LOW (ref 80.0–100.0)
Monocytes Absolute: 0.7 10*3/uL (ref 0.1–1.0)
Monocytes Relative: 8 %
Neutro Abs: 5.7 10*3/uL (ref 1.7–7.7)
Neutrophils Relative %: 65 %
Platelets: 358 10*3/uL (ref 150–400)
RBC: 4.8 MIL/uL (ref 3.87–5.11)
RDW: 17.1 % — ABNORMAL HIGH (ref 11.5–15.5)
WBC: 8.8 10*3/uL (ref 4.0–10.5)
nRBC: 0 % (ref 0.0–0.2)

## 2019-10-14 LAB — COMPREHENSIVE METABOLIC PANEL
ALT: 14 U/L (ref 0–44)
AST: 18 U/L (ref 15–41)
Albumin: 4 g/dL (ref 3.5–5.0)
Alkaline Phosphatase: 64 U/L (ref 38–126)
Anion gap: 12 (ref 5–15)
BUN: 11 mg/dL (ref 6–20)
CO2: 23 mmol/L (ref 22–32)
Calcium: 9 mg/dL (ref 8.9–10.3)
Chloride: 105 mmol/L (ref 98–111)
Creatinine, Ser: 0.89 mg/dL (ref 0.44–1.00)
GFR calc Af Amer: >60 mL/min (ref >60)
GFR calc non Af Amer: >60 mL/min (ref >60)
Glucose, Bld: 132 mg/dL — ABNORMAL HIGH (ref 70–99)
Potassium: 3.3 mmol/L — ABNORMAL LOW (ref 3.5–5.1)
Sodium: 140 mmol/L (ref 135–145)
Total Bilirubin: 0.5 mg/dL (ref 0.3–1.2)
Total Protein: 8 g/dL (ref 6.5–8.1)

## 2019-10-14 LAB — TYPE AND SCREEN
ABO/RH(D): A POS
Antibody Screen: NEGATIVE

## 2019-10-14 LAB — SARS CORONAVIRUS 2 (TAT 6-24 HRS): SARS Coronavirus 2: NEGATIVE

## 2019-10-16 ENCOUNTER — Inpatient Hospital Stay: Payer: Self-pay | Admitting: Certified Registered"

## 2019-10-16 ENCOUNTER — Inpatient Hospital Stay
Admission: RE | Admit: 2019-10-16 | Discharge: 2019-10-19 | DRG: 742 | Disposition: A | Discharge status: Home / Self Care | Payer: Self-pay | Attending: Obstetrics and Gynecology | Admitting: Obstetrics and Gynecology

## 2019-10-16 ENCOUNTER — Other Ambulatory Visit: Payer: Self-pay

## 2019-10-16 ENCOUNTER — Encounter: Payer: Self-pay | Admitting: Obstetrics and Gynecology

## 2019-10-16 ENCOUNTER — Encounter
Admission: RE | Disposition: A | Discharge status: Home / Self Care | Payer: Self-pay | Source: Home / Self Care | Attending: Obstetrics and Gynecology

## 2019-10-16 DIAGNOSIS — N858 Other specified noninflammatory disorders of uterus: Secondary | ICD-10-CM | POA: Diagnosis present

## 2019-10-16 DIAGNOSIS — K567 Ileus, unspecified: Secondary | ICD-10-CM | POA: Diagnosis present

## 2019-10-16 DIAGNOSIS — N838 Other noninflammatory disorders of ovary, fallopian tube and broad ligament: Secondary | ICD-10-CM

## 2019-10-16 DIAGNOSIS — N888 Other specified noninflammatory disorders of cervix uteri: Secondary | ICD-10-CM

## 2019-10-16 DIAGNOSIS — Z87891 Personal history of nicotine dependence: Secondary | ICD-10-CM

## 2019-10-16 DIAGNOSIS — Z90710 Acquired absence of both cervix and uterus: Secondary | ICD-10-CM

## 2019-10-16 DIAGNOSIS — N920 Excessive and frequent menstruation with regular cycle: Secondary | ICD-10-CM | POA: Diagnosis present

## 2019-10-16 DIAGNOSIS — D259 Leiomyoma of uterus, unspecified: Secondary | ICD-10-CM

## 2019-10-16 DIAGNOSIS — N736 Female pelvic peritoneal adhesions (postinfective): Secondary | ICD-10-CM

## 2019-10-16 DIAGNOSIS — Z9889 Other specified postprocedural states: Secondary | ICD-10-CM

## 2019-10-16 DIAGNOSIS — N800 Endometriosis of uterus: Secondary | ICD-10-CM

## 2019-10-16 DIAGNOSIS — K9189 Other postprocedural complications and disorders of digestive system: Secondary | ICD-10-CM | POA: Diagnosis present

## 2019-10-16 DIAGNOSIS — D509 Iron deficiency anemia, unspecified: Secondary | ICD-10-CM | POA: Diagnosis present

## 2019-10-16 DIAGNOSIS — N921 Excessive and frequent menstruation with irregular cycle: Secondary | ICD-10-CM

## 2019-10-16 DIAGNOSIS — R112 Nausea with vomiting, unspecified: Secondary | ICD-10-CM

## 2019-10-16 HISTORY — PX: HYSTERECTOMY ABDOMINAL WITH SALPINGECTOMY: SHX6725

## 2019-10-16 HISTORY — PX: LAPAROTOMY WITH STAGING: SHX5938

## 2019-10-16 HISTORY — DX: Other specified noninflammatory disorders of cervix uteri: N88.8

## 2019-10-16 HISTORY — DX: Acquired absence of both cervix and uterus: Z90.710

## 2019-10-16 LAB — CREATININE, URINE, RANDOM: Creatinine, Urine: 201 mg/dL

## 2019-10-16 LAB — CBC
HCT: 32.8 % — ABNORMAL LOW (ref 36.0–46.0)
Hemoglobin: 10.9 g/dL — ABNORMAL LOW (ref 12.0–15.0)
MCH: 24.8 pg — ABNORMAL LOW (ref 26.0–34.0)
MCHC: 33.2 g/dL (ref 30.0–36.0)
MCV: 74.5 fL — ABNORMAL LOW (ref 80.0–100.0)
Platelets: 321 10*3/uL (ref 150–400)
RBC: 4.4 MIL/uL (ref 3.87–5.11)
RDW: 16.5 % — ABNORMAL HIGH (ref 11.5–15.5)
WBC: 14.8 10*3/uL — ABNORMAL HIGH (ref 4.0–10.5)
nRBC: 0 % (ref 0.0–0.2)

## 2019-10-16 LAB — POCT PREGNANCY, URINE: Preg Test, Ur: NEGATIVE

## 2019-10-16 LAB — BASIC METABOLIC PANEL
Anion gap: 10 (ref 5–15)
BUN: 10 mg/dL (ref 6–20)
CO2: 21 mmol/L — ABNORMAL LOW (ref 22–32)
Calcium: 8.6 mg/dL — ABNORMAL LOW (ref 8.9–10.3)
Chloride: 105 mmol/L (ref 98–111)
Creatinine, Ser: 1.1 mg/dL — ABNORMAL HIGH (ref 0.44–1.00)
GFR calc Af Amer: >60 mL/min (ref >60)
GFR calc non Af Amer: 58 mL/min — ABNORMAL LOW (ref >60)
Glucose, Bld: 146 mg/dL — ABNORMAL HIGH (ref 70–99)
Potassium: 4 mmol/L (ref 3.5–5.1)
Sodium: 136 mmol/L (ref 135–145)

## 2019-10-16 LAB — SODIUM, URINE, RANDOM: Sodium, Ur: 45 mmol/L

## 2019-10-16 SURGERY — HYSTERECTOMY, TOTAL, ABDOMINAL, WITH SALPINGECTOMY
Anesthesia: General | Site: Abdomen

## 2019-10-16 MED ORDER — ORAL CARE MOUTH RINSE: 15.0000 mL | Freq: Once | OROMUCOSAL | Status: AC

## 2019-10-16 MED ORDER — HYDROMORPHONE HCL 1 MG/ML IJ SOLN
INTRAMUSCULAR | Status: AC
  Filled 2019-10-16: qty 1

## 2019-10-16 MED ORDER — PROMETHAZINE HCL 25 MG/ML IJ SOLN
6.2500 mg | Freq: Once | INTRAMUSCULAR | Status: AC
  Administered 2019-10-16: 6.25 mg via INTRAVENOUS

## 2019-10-16 MED ORDER — SUCCINYLCHOLINE CHLORIDE 200 MG/10ML IV SOSY
PREFILLED_SYRINGE | INTRAVENOUS | Status: AC
  Filled 2019-10-16: qty 10

## 2019-10-16 MED ORDER — ONDANSETRON HCL 4 MG PO TABS: 4.0000 mg | ORAL_TABLET | Freq: Four times a day (QID) | ORAL | Status: DC | PRN

## 2019-10-16 MED ORDER — ONDANSETRON HCL 4 MG/2ML IJ SOLN
INTRAMUSCULAR | Status: AC
  Filled 2019-10-16: qty 2

## 2019-10-16 MED ORDER — KETOROLAC TROMETHAMINE 30 MG/ML IJ SOLN: 15.0000 mg | Freq: Four times a day (QID) | INTRAMUSCULAR | Status: DC

## 2019-10-16 MED ORDER — SODIUM CHLORIDE FLUSH 0.9 % IV SOLN
INTRAVENOUS | Status: AC
  Filled 2019-10-16: qty 3

## 2019-10-16 MED ORDER — HYDROMORPHONE HCL 1 MG/ML IJ SOLN
INTRAMUSCULAR | Status: DC | PRN
  Administered 2019-10-16: 1 mg via INTRAVENOUS

## 2019-10-16 MED ORDER — DOCUSATE SODIUM 100 MG PO CAPS
100.0000 mg | ORAL_CAPSULE | Freq: Two times a day (BID) | ORAL | Status: DC
  Administered 2019-10-17 – 2019-10-19 (×6): 100 mg via ORAL
  Filled 2019-10-16 (×6): qty 1

## 2019-10-16 MED ORDER — ACETAMINOPHEN 10 MG/ML IV SOLN
INTRAVENOUS | Status: AC
  Filled 2019-10-16: qty 100

## 2019-10-16 MED ORDER — CHLORHEXIDINE GLUCONATE 0.12 % MT SOLN
OROMUCOSAL | Status: AC
  Administered 2019-10-16: 15 mL via OROMUCOSAL
  Filled 2019-10-16: qty 15

## 2019-10-16 MED ORDER — OXYCODONE-ACETAMINOPHEN 5-325 MG PO TABS: 1.0000 | ORAL_TABLET | Freq: Four times a day (QID) | ORAL | Status: DC | PRN

## 2019-10-16 MED ORDER — FENTANYL CITRATE (PF) 100 MCG/2ML IJ SOLN
INTRAMUSCULAR | Status: AC
  Filled 2019-10-16: qty 2

## 2019-10-16 MED ORDER — ROCURONIUM BROMIDE 100 MG/10ML IV SOLN
INTRAVENOUS | Status: DC | PRN
  Administered 2019-10-16: 50 mg via INTRAVENOUS
  Administered 2019-10-16: 100 mg via INTRAVENOUS

## 2019-10-16 MED ORDER — ONDANSETRON HCL 4 MG/2ML IJ SOLN
INTRAMUSCULAR | Status: DC | PRN
  Administered 2019-10-16: 4 mg via INTRAVENOUS

## 2019-10-16 MED ORDER — KETOROLAC TROMETHAMINE 30 MG/ML IJ SOLN
INTRAMUSCULAR | Status: AC
  Filled 2019-10-16: qty 1

## 2019-10-16 MED ORDER — ONDANSETRON HCL 4 MG/2ML IJ SOLN
4.0000 mg | Freq: Once | INTRAMUSCULAR | Status: AC | PRN
  Administered 2019-10-16: 4 mg via INTRAVENOUS

## 2019-10-16 MED ORDER — SIMETHICONE 80 MG PO CHEW
80.0000 mg | CHEWABLE_TABLET | Freq: Four times a day (QID) | ORAL | Status: DC | PRN
  Administered 2019-10-16 – 2019-10-17 (×3): 80 mg via ORAL
  Filled 2019-10-16 (×3): qty 1

## 2019-10-16 MED ORDER — FENTANYL CITRATE (PF) 250 MCG/5ML IJ SOLN
INTRAMUSCULAR | Status: AC
  Filled 2019-10-16: qty 5

## 2019-10-16 MED ORDER — PHENYLEPHRINE HCL (PRESSORS) 10 MG/ML IV SOLN
INTRAVENOUS | Status: DC | PRN
  Administered 2019-10-16: 200 ug via INTRAVENOUS

## 2019-10-16 MED ORDER — OXYCODONE-ACETAMINOPHEN 5-325 MG PO TABS
2.0000 | ORAL_TABLET | Freq: Four times a day (QID) | ORAL | Status: DC | PRN
  Administered 2019-10-17: 2 via ORAL
  Filled 2019-10-16 (×2): qty 2

## 2019-10-16 MED ORDER — PROPOFOL 10 MG/ML IV BOLUS
INTRAVENOUS | Status: AC
  Filled 2019-10-16: qty 20

## 2019-10-16 MED ORDER — MENTHOL 3 MG MT LOZG
1.0000 | LOZENGE | OROMUCOSAL | Status: DC | PRN
  Filled 2019-10-16: qty 9

## 2019-10-16 MED ORDER — PROMETHAZINE HCL 25 MG/ML IJ SOLN
INTRAMUSCULAR | Status: AC
  Filled 2019-10-16: qty 1

## 2019-10-16 MED ORDER — IBUPROFEN 800 MG PO TABS: 800.0000 mg | ORAL_TABLET | Freq: Three times a day (TID) | ORAL | Status: DC

## 2019-10-16 MED ORDER — PROPOFOL 10 MG/ML IV BOLUS
INTRAVENOUS | Status: DC | PRN
  Administered 2019-10-16: 150 mg via INTRAVENOUS

## 2019-10-16 MED ORDER — CEFAZOLIN SODIUM-DEXTROSE 2-4 GM/100ML-% IV SOLN
INTRAVENOUS | Status: AC
  Filled 2019-10-16: qty 100

## 2019-10-16 MED ORDER — SODIUM CHLORIDE FLUSH 0.9 % IV SOLN
INTRAVENOUS | Status: AC
  Filled 2019-10-16: qty 10

## 2019-10-16 MED ORDER — LIDOCAINE HCL (PF) 2 % IJ SOLN
INTRAMUSCULAR | Status: AC
  Filled 2019-10-16: qty 5

## 2019-10-16 MED ORDER — ACETAMINOPHEN 500 MG PO TABS: 1000.0000 mg | ORAL_TABLET | ORAL | Status: AC

## 2019-10-16 MED ORDER — DEXAMETHASONE SODIUM PHOSPHATE 10 MG/ML IJ SOLN
INTRAMUSCULAR | Status: AC
  Filled 2019-10-16: qty 1

## 2019-10-16 MED ORDER — FENTANYL CITRATE (PF) 100 MCG/2ML IJ SOLN
INTRAMUSCULAR | Status: DC | PRN
  Administered 2019-10-16 (×2): 100 ug via INTRAVENOUS
  Administered 2019-10-16: 50 ug via INTRAVENOUS

## 2019-10-16 MED ORDER — SODIUM CHLORIDE 0.9 % IV BOLUS
1000.0000 mL | Freq: Once | INTRAVENOUS | Status: AC
  Administered 2019-10-16: 1000 mL via INTRAVENOUS

## 2019-10-16 MED ORDER — FAMOTIDINE 20 MG PO TABS
ORAL_TABLET | ORAL | Status: AC
  Administered 2019-10-16: 20 mg via ORAL
  Filled 2019-10-16: qty 1

## 2019-10-16 MED ORDER — LACTATED RINGERS IV SOLN: INTRAVENOUS | Status: DC

## 2019-10-16 MED ORDER — CHLORHEXIDINE GLUCONATE CLOTH 2 % EX PADS: 6.0000 | MEDICATED_PAD | Freq: Once | CUTANEOUS | Status: DC

## 2019-10-16 MED ORDER — LIDOCAINE HCL (CARDIAC) PF 100 MG/5ML IV SOSY
PREFILLED_SYRINGE | INTRAVENOUS | Status: DC | PRN
  Administered 2019-10-16: 80 mg via INTRAVENOUS

## 2019-10-16 MED ORDER — FENTANYL CITRATE (PF) 100 MCG/2ML IJ SOLN
25.0000 ug | INTRAMUSCULAR | Status: DC | PRN
  Administered 2019-10-16 (×5): 25 ug via INTRAVENOUS

## 2019-10-16 MED ORDER — HYDROMORPHONE HCL 1 MG/ML IJ SOLN: 0.5000 mg | INTRAMUSCULAR | Status: DC | PRN

## 2019-10-16 MED ORDER — FAMOTIDINE 20 MG PO TABS: 20.0000 mg | ORAL_TABLET | Freq: Once | ORAL | Status: AC

## 2019-10-16 MED ORDER — ENOXAPARIN SODIUM 40 MG/0.4ML ~~LOC~~ SOLN
40.0000 mg | Freq: Once | SUBCUTANEOUS | Status: AC
  Administered 2019-10-16: 40 mg via SUBCUTANEOUS

## 2019-10-16 MED ORDER — CEFAZOLIN SODIUM-DEXTROSE 2-4 GM/100ML-% IV SOLN
2.0000 g | INTRAVENOUS | Status: AC
  Administered 2019-10-16: 2 g via INTRAVENOUS

## 2019-10-16 MED ORDER — FLEET ENEMA 7-19 GM/118ML RE ENEM
1.0000 | ENEMA | Freq: Once | RECTAL | Status: AC
  Administered 2019-10-15: 1 via RECTAL

## 2019-10-16 MED ORDER — ROCURONIUM BROMIDE 10 MG/ML (PF) SYRINGE
PREFILLED_SYRINGE | INTRAVENOUS | Status: AC
  Filled 2019-10-16: qty 10

## 2019-10-16 MED ORDER — HYDROMORPHONE HCL 1 MG/ML IJ SOLN
1.0000 mg | INTRAMUSCULAR | Status: DC | PRN
  Administered 2019-10-16 – 2019-10-17 (×5): 1 mg via INTRAVENOUS
  Filled 2019-10-16 (×6): qty 1

## 2019-10-16 MED ORDER — DEXAMETHASONE SODIUM PHOSPHATE 10 MG/ML IJ SOLN
INTRAMUSCULAR | Status: DC | PRN
  Administered 2019-10-16: 10 mg via INTRAVENOUS

## 2019-10-16 MED ORDER — SUGAMMADEX SODIUM 200 MG/2ML IV SOLN
INTRAVENOUS | Status: DC | PRN
  Administered 2019-10-16: 200 mg via INTRAVENOUS

## 2019-10-16 MED ORDER — MIDAZOLAM HCL 2 MG/2ML IJ SOLN
INTRAMUSCULAR | Status: DC | PRN
  Administered 2019-10-16: 2 mg via INTRAVENOUS

## 2019-10-16 MED ORDER — ENOXAPARIN SODIUM 40 MG/0.4ML ~~LOC~~ SOLN
40.0000 mg | SUBCUTANEOUS | Status: DC
  Administered 2019-10-17 – 2019-10-18 (×2): 40 mg via SUBCUTANEOUS
  Filled 2019-10-16 (×2): qty 0.4

## 2019-10-16 MED ORDER — ACETAMINOPHEN 500 MG PO TABS
ORAL_TABLET | ORAL | Status: AC
  Administered 2019-10-16: 1000 mg via ORAL
  Filled 2019-10-16: qty 2

## 2019-10-16 MED ORDER — HYDROMORPHONE HCL 1 MG/ML IJ SOLN
0.5000 mg | INTRAMUSCULAR | Status: DC | PRN
  Administered 2019-10-16 (×2): 0.5 mg via INTRAVENOUS

## 2019-10-16 MED ORDER — MIDAZOLAM HCL 2 MG/2ML IJ SOLN
INTRAMUSCULAR | Status: AC
  Filled 2019-10-16: qty 2

## 2019-10-16 MED ORDER — KETOROLAC TROMETHAMINE 30 MG/ML IJ SOLN
30.0000 mg | Freq: Four times a day (QID) | INTRAMUSCULAR | Status: DC
  Administered 2019-10-16: 30 mg via INTRAVENOUS

## 2019-10-16 MED ORDER — CHLORHEXIDINE GLUCONATE 0.12 % MT SOLN: 15.0000 mL | Freq: Once | OROMUCOSAL | Status: AC

## 2019-10-16 MED ORDER — ONDANSETRON HCL 4 MG/2ML IJ SOLN
4.0000 mg | Freq: Four times a day (QID) | INTRAMUSCULAR | Status: DC | PRN
  Administered 2019-10-16 – 2019-10-17 (×3): 4 mg via INTRAVENOUS
  Filled 2019-10-16 (×3): qty 2

## 2019-10-16 SURGICAL SUPPLY — 58 items
BLADE SURG 15 STRL LF DISP TIS (BLADE) ×2 IMPLANT
BLADE SURG 15 STRL SS (BLADE) ×2
CANISTER SUCT 1200ML W/VALVE (MISCELLANEOUS) ×4 IMPLANT
CLOSURE WOUND 1/2 X4 (GAUZE/BANDAGES/DRESSINGS) ×1
CNTNR SPEC 2.5X3XGRAD LEK (MISCELLANEOUS) ×2
CONT SPEC 4OZ STER OR WHT (MISCELLANEOUS) ×2
CONTAINER SPEC 2.5X3XGRAD LEK (MISCELLANEOUS) ×2 IMPLANT
COVER WAND RF STERILE (DRAPES) ×4 IMPLANT
DRAPE LAP W/FLUID (DRAPES) ×4 IMPLANT
DRAPE LAPAROTOMY 100X77 ABD (DRAPES) ×4 IMPLANT
DRAPE LAPAROTOMY TRNSV 106X77 (MISCELLANEOUS) ×4 IMPLANT
DRAPE LEGGINS SURG 28X43 STRL (DRAPES) ×4 IMPLANT
DRAPE PERI LITHO V/GYN (MISCELLANEOUS) ×4 IMPLANT
DRAPE UNDER BUTTOCK W/FLU (DRAPES) ×4 IMPLANT
DRSG OPSITE POSTOP 4X14 (GAUZE/BANDAGES/DRESSINGS) ×4 IMPLANT
DRSG TELFA 3X8 NADH (GAUZE/BANDAGES/DRESSINGS) ×4 IMPLANT
ELECT BLADE 6 FLAT ULTRCLN (ELECTRODE) ×4 IMPLANT
ELECT CAUTERY BLADE 6.4 (BLADE) ×8 IMPLANT
ELECT REM PT RETURN 9FT ADLT (ELECTROSURGICAL) ×4
ELECTRODE REM PT RTRN 9FT ADLT (ELECTROSURGICAL) ×2 IMPLANT
GAUZE 4X4 16PLY RFD (DISPOSABLE) ×4 IMPLANT
GAUZE SPONGE 4X4 12PLY STRL (GAUZE/BANDAGES/DRESSINGS) ×4 IMPLANT
GLOVE BIO SURGEON STRL SZ8 (GLOVE) ×12 IMPLANT
GLOVE BIOGEL M 7.0 STRL (GLOVE) ×12 IMPLANT
GLOVE INDICATOR 7.5 STRL GRN (GLOVE) ×12 IMPLANT
GLOVE INDICATOR 8.0 STRL GRN (GLOVE) ×4 IMPLANT
GOWN STRL REUS W/ TWL LRG LVL3 (GOWN DISPOSABLE) ×8 IMPLANT
GOWN STRL REUS W/ TWL XL LVL3 (GOWN DISPOSABLE) ×4 IMPLANT
GOWN STRL REUS W/TWL LRG LVL3 (GOWN DISPOSABLE) ×8
GOWN STRL REUS W/TWL XL LVL3 (GOWN DISPOSABLE) ×4
KIT IMAGING PINPOINTPAQ (MISCELLANEOUS) IMPLANT
KIT PINK PAD W/HEAD ARE REST (MISCELLANEOUS) ×4
KIT PINK PAD W/HEAD ARM REST (MISCELLANEOUS) ×2 IMPLANT
KIT TURNOVER CYSTO (KITS) ×4 IMPLANT
KIT TURNOVER KIT A (KITS) ×4 IMPLANT
LABEL OR SOLS (LABEL) ×4 IMPLANT
LIGASURE IMPACT 36 18CM CVD LR (INSTRUMENTS) ×4 IMPLANT
NDL HPO THNWL 1X22GA REG BVL (NEEDLE) ×2 IMPLANT
NEEDLE SAFETY 22GX1 (NEEDLE) ×2
NS IRRIG 1000ML POUR BTL (IV SOLUTION) ×4 IMPLANT
PACK BASIN MAJOR ARMC (MISCELLANEOUS) ×4 IMPLANT
PAD OB MATERNITY 4.3X12.25 (PERSONAL CARE ITEMS) ×4 IMPLANT
SPONGE LAP 18X18 RF (DISPOSABLE) ×20 IMPLANT
STAPLER SKIN PROX 35W (STAPLE) ×4 IMPLANT
STRIP CLOSURE SKIN 1/2X4 (GAUZE/BANDAGES/DRESSINGS) ×3 IMPLANT
SUT PDS AB 1 TP1 96 (SUTURE) ×8 IMPLANT
SUT PLAIN 2 0 XLH (SUTURE) ×4 IMPLANT
SUT VIC AB 0 CT1 27 (SUTURE) ×4
SUT VIC AB 0 CT1 27XCR 8 STRN (SUTURE) ×4 IMPLANT
SUT VIC AB 0 CT1 36 (SUTURE) ×8 IMPLANT
SUT VIC AB 2-0 SH 27 (SUTURE) ×2
SUT VIC AB 2-0 SH 27XBRD (SUTURE) ×2 IMPLANT
SUT VICRYL AB 3-0 FS1 BRD 27IN (SUTURE) ×4 IMPLANT
SYR BULB IRRIG 60ML STRL (SYRINGE) ×4 IMPLANT
SYR CONTROL 10ML LL (SYRINGE) ×4 IMPLANT
TRAY FOLEY MTR SLVR 16FR STAT (SET/KITS/TRAYS/PACK) ×4 IMPLANT
TRAY PREP VAG/GEN (MISCELLANEOUS) ×4 IMPLANT
megadyne E-Z clean bovie tip ×4 IMPLANT

## 2019-10-16 NOTE — Transfer of Care (Signed)
Immediate Anesthesia Transfer of Care Note  Patient: Courtney Cruz  Procedure(s) Performed: HYSTERECTOMY ABDOMINAL WITH SALPINGECTOMY (Bilateral Abdomen) LAPAROTOMY EXPLORATORY (N/A Abdomen)  Patient Location: PACU  Anesthesia Type:General  Level of Consciousness: awake  Airway & Oxygen Therapy: Patient Spontanous Breathing and Patient connected to face mask oxygen  Post-op Assessment: Report given to RN and Post -op Vital signs reviewed and stable  Post vital signs: Reviewed and stable  Last Vitals:  Vitals Value Taken Time  BP    Temp    Pulse    Resp    SpO2      Last Pain:  Vitals:   10/16/19 0708  TempSrc: Tympanic  PainSc: 0-No pain         Complications: No complications documented.
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
## 2019-10-16 NOTE — Interval H&P Note (Signed)
History and Physical Interval Note:  10/16/2019 7:42 AM  Courtney Cruz  has presented today for surgery, with the diagnosis of Cervical mass [N88.8] , Menorrhagia w regular cycle [N92.0], Uterine mass [N95.8].  The various methods of treatment have been discussed with the patient and family. After consideration of risks, benefits and other options for treatment, the patient has consented to  Procedure(s): HYSTERECTOMY ABDOMINAL WITH SALPINGECTOMY (N/A) LAPAROTOMY WITH STAGING (N/A) as a surgical intervention.  The patient's history has been reviewed, patient examined, no change in status, stable for surgery.  I have reviewed the patient's chart and labs.  Questions were answered to the patient's satisfaction.    BP 130/71   Pulse 84   Temp (!) 97.2 F (36.2 C) (Tympanic)   Resp 18   LMP 09/28/2019   SpO2 100%   Gen: NAD CV: RRR Pulm: CTAB Ext: no e/c/t  Plan: Proceed with above surgery.  Prentice Docker, MD, [facility], [facility] 10/16/2019 7:43 AM

## 2019-10-16 NOTE — Anesthesia Procedure Notes (Signed)
Procedure Name: Intubation Performed by: Gaynelle Cage, CRNA Pre-anesthesia Checklist: Patient identified, Emergency Drugs available, Suction available and Patient being monitored Patient Re-evaluated:Patient Re-evaluated prior to induction Oxygen Delivery Method: Circle system utilized Preoxygenation: Pre-oxygenation with 100% oxygen Induction Type: IV induction and Cricoid Pressure applied Ventilation: Mask ventilation without difficulty and Oral airway inserted - appropriate to patient size Laryngoscope Size: Mac and 3 Grade View: Grade II Tube type: Oral Tube size: 7.0 mm Number of attempts: 1 Airway Equipment and Method: Oral airway Placement Confirmation: ETT inserted through vocal cords under direct vision,  positive ETCO2 and breath sounds checked- equal and bilateral Secured at: 21 cm Tube secured with: Tape Dental Injury: Teeth and Oropharynx as per pre-operative assessment

## 2019-10-16 NOTE — Progress Notes (Signed)
FENA 0.2% pre-renal.  CBC with stable H&H.  Stopped toradol and ibuprofen 1L NS bolus.

## 2019-10-16 NOTE — Anesthesia Preprocedure Evaluation (Signed)
Anesthesia Evaluation  Patient identified by MRN, date of birth, ID band Patient awake    Reviewed: Allergy & Precautions, H&P , NPO status , Patient's Chart, lab work & pertinent test results, reviewed documented beta blocker date and time   Airway Mallampati: II  TM Distance: >3 FB Neck ROM: full    Dental  (+) Teeth Intact   Pulmonary neg pulmonary ROS, former smoker,    Pulmonary exam normal        Cardiovascular Exercise Tolerance: Good negative cardio ROS Normal cardiovascular exam Rhythm:regular Rate:Normal     Neuro/Psych  Headaches, negative psych ROS   GI/Hepatic Neg liver ROS, GERD  Medicated,  Endo/Other  negative endocrine ROS  Renal/GU negative Renal ROS  negative genitourinary   Musculoskeletal   Abdominal   Peds  Hematology  (+) Blood dyscrasia, anemia ,   Anesthesia Other Findings Past Medical History: No date: Anemia No date: GERD (gastroesophageal reflux disease) No date: Headache No date: Uterine fibroid Past Surgical History: No date: CESAREAN SECTION   Reproductive/Obstetrics negative OB ROS                             Anesthesia Physical Anesthesia Plan  ASA: II  Anesthesia Plan: General ETT   Post-op Pain Management:    Induction:   PONV Risk Score and Plan: 4 or greater  Airway Management Planned:   Additional Equipment:   Intra-op Plan:   Post-operative Plan:   Informed Consent: I have reviewed the patients History and Physical, chart, labs and discussed the procedure including the risks, benefits and alternatives for the proposed anesthesia with the patient or authorized representative who has indicated his/her understanding and acceptance.     Dental Advisory Given  Plan Discussed with: CRNA  Anesthesia Plan Comments:         Anesthesia Quick Evaluation

## 2019-10-16 NOTE — Op Note (Addendum)
Operative Note   10/16/2019 12:27 PM  PRE-OP DIAGNOSIS: Cervical mass N88.8 Menorrhagia w regular cycle N92.0 Uterine mass N95.8    POST-OP DIAGNOSIS: Benign mucus collection in obstructed cervix, uterine fundus with dense adhesions to anterior abdominal wall  SURGEON: Surgeon(s) and Role:    * Will Bonnet, MD - Primary    Mellody Drown, MD - Assisting: No other capable assistant available, in surgery requiring high level assistant.  ANESTHESIA: General   PROCEDURE: HYSTERECTOMY TOTAL ABDOMINAL WITH BILATERAL SALPINGECTOMY, OMENTAL BIOPSY  ESTIMATED BLOOD LOSS: less than 100 mL  DRAINS: none   TOTAL IV FLUIDS: 2 L  SPECIMENS: uterus, fallopian tubes, omental biopsy   COMPLICATIONS: none  DISPOSITION: PACU - hemodynamically stable.  CONDITION: stable  INDICATIONS: cervical mass and fluid collection.    FINDINGS: Cervix could not be palpated vaginally.  On laparotomy there were some omental adhesions to the uterus and lower anterior abdominal wall.  Uterus was slightly enlarged and densely adherent to the lowe abdominal wall on the right.  Normal adnexa with some functional cysts on the right ovary. Frozen section showed benign disease with endocervical mucus collection, probably due to obstruction of outflow.    PROCEDURE IN DETAIL: After informed consent was obtained, the patient was taken to the operating room where general endotracheal anesthesia was performed without difficulty. The patient was positioned in the dorsal lithotomy position in [place] and the usual precautions taken with her arms in arm boards bilaterally. She was prepped and draped in normal sterile fashion. Time-out was performed and a Foley catheter was placed into the bladder.  A vertical right paramedian incision was made through the old scar and carried down to the underlying fascia. The fascia was incised in the midline, and the peritoneum was then entered. After the incision was  extended the operative findings noted above were identified. Omental adhesions were lysed with the bovie. The bowel was freed up and placed up into the upper abdomen, packed away and a Bookwalter abdominal retractor was then placed. The dense adhesions of the uterus to the abdominal wall on the right were taken down with the bovie.  The bladder was not close to this area.    Round ligaments were then divided on each side with electrocautery and the retroperitoneal space was opened bilaterally. The ureters were identified and preserved. The utero-ovarian ligaments and fallopian tubes were transected bilaterally with the LigaSure Impact. The bladder flap was created with electrocautery and the bladder was dissected down off the lower uterine segment and cervix. The uterine arteries were then skeletonized bilaterally clamped, cut, and divided, then suture ligated with 0 Vicryl. The cardinal ligaments were then clamped cut and divided and suture ligated as well. An EEA sizer was placed in the vagina to identify the cervix. The vaginal angles were then clamped with hysterectomy clamps, cut and suture ligated and tagged. The specimen was then amputated from the vagina using Jorgensen scissors and the vaginal walls were grasped with allis clamps. The vaginal cuff was then closed with 0 Vicryl suture in a figure-of-eight fashion with careful attention to include the vaginal cuff angles and the vaginal mucosa within the closure and avoid the bladder. I examined the gross specimen with the pathologist and frozen was benign.  All the pedicles were examined and hemostatic.  After the abdomen was irrigated and all pedicles felt to be hemostatic the laparotomy packs were removed and the fascia was then reapproximated in a running mass abdominal wall  closure using 1 loop PDS wit two sutures tied in the middle. Prior to closure the fascial edges were freshened up by dissecting the adipose tissue away about 1-2 cm. The  subcutaneous space was then irrigated and closed with a running 2-0 VIcryl, and the skin was closed with staples. The patient tolerated the procedure well. Sponge, lap, needle and instrument counts were correct x2 at the end of the procedure.   Antibiotics: Ancef given  VTE prophylaxis: was ordered perioperatively with pre op Lovonox.  The assistant surgeon was an MD due to lack of availability of another Counselling psychologist.  The surgical assistant performed tissue retraction, lysis of adhesions, tissue dissection, assistance with suturing, and assessment for signs of malignancy.   Mellody Drown, MD  I performed the above-documented surgery, as written above with the assistance of Dr. Fransisca Connors.     Prentice Docker, MD, [facility], [facility] 10/19/2019 2:01 PM

## 2019-10-17 ENCOUNTER — Inpatient Hospital Stay: Payer: Self-pay

## 2019-10-17 DIAGNOSIS — N888 Other specified noninflammatory disorders of cervix uteri: Principal | ICD-10-CM

## 2019-10-17 LAB — CBC
HCT: 30.9 % — ABNORMAL LOW (ref 36.0–46.0)
HCT: 33.1 % — ABNORMAL LOW (ref 36.0–46.0)
Hemoglobin: 9.7 g/dL — ABNORMAL LOW (ref 12.0–15.0)
Hemoglobin: 10.6 g/dL — ABNORMAL LOW (ref 12.0–15.0)
MCH: 24.3 pg — ABNORMAL LOW (ref 26.0–34.0)
MCH: 24.7 pg — ABNORMAL LOW (ref 26.0–34.0)
MCHC: 31.4 g/dL (ref 30.0–36.0)
MCHC: 32 g/dL (ref 30.0–36.0)
MCV: 77.4 fL — ABNORMAL LOW (ref 80.0–100.0)
MCV: 77.2 fL — ABNORMAL LOW (ref 80.0–100.0)
Platelets: 283 10*3/uL (ref 150–400)
Platelets: 309 10*3/uL (ref 150–400)
RBC: 3.99 MIL/uL (ref 3.87–5.11)
RBC: 4.29 MIL/uL (ref 3.87–5.11)
RDW: 16.6 % — ABNORMAL HIGH (ref 11.5–15.5)
RDW: 16.9 % — ABNORMAL HIGH (ref 11.5–15.5)
WBC: 10.9 10*3/uL — ABNORMAL HIGH (ref 4.0–10.5)
WBC: 10.6 10*3/uL — ABNORMAL HIGH (ref 4.0–10.5)
nRBC: 0 % (ref 0.0–0.2)
nRBC: 0 % (ref 0.0–0.2)

## 2019-10-17 LAB — BASIC METABOLIC PANEL
Anion gap: 10 (ref 5–15)
BUN: 9 mg/dL (ref 6–20)
CO2: 23 mmol/L (ref 22–32)
Calcium: 8.4 mg/dL — ABNORMAL LOW (ref 8.9–10.3)
Chloride: 105 mmol/L (ref 98–111)
Creatinine, Ser: 1.01 mg/dL — ABNORMAL HIGH (ref 0.44–1.00)
GFR calc Af Amer: >60 mL/min (ref >60)
GFR calc non Af Amer: >60 mL/min (ref >60)
Glucose, Bld: 127 mg/dL — ABNORMAL HIGH (ref 70–99)
Potassium: 3.6 mmol/L (ref 3.5–5.1)
Sodium: 138 mmol/L (ref 135–145)

## 2019-10-17 LAB — SURGICAL PATHOLOGY

## 2019-10-17 MED ORDER — SIMETHICONE 80 MG PO CHEW
80.0000 mg | CHEWABLE_TABLET | Freq: Four times a day (QID) | ORAL | Status: DC
  Administered 2019-10-17 – 2019-10-19 (×7): 80 mg via ORAL
  Filled 2019-10-17 (×7): qty 1

## 2019-10-17 MED ORDER — KCL IN DEXTROSE-NACL 20-5-0.9 MEQ/L-%-% IV SOLN
INTRAVENOUS | Status: DC
  Filled 2019-10-17 (×10): qty 1000

## 2019-10-17 MED ORDER — PROMETHAZINE HCL 25 MG/ML IJ SOLN
12.5000 mg | Freq: Four times a day (QID) | INTRAMUSCULAR | Status: DC | PRN
  Administered 2019-10-17: 12.5 mg via INTRAVENOUS
  Filled 2019-10-17: qty 1

## 2019-10-17 MED ORDER — FAMOTIDINE 20 MG PO TABS: 20.0000 mg | ORAL_TABLET | Freq: Two times a day (BID) | ORAL | Status: DC

## 2019-10-17 MED ORDER — METOCLOPRAMIDE HCL 5 MG/ML IJ SOLN
10.0000 mg | Freq: Four times a day (QID) | INTRAMUSCULAR | Status: DC
  Administered 2019-10-17 – 2019-10-18 (×5): 10 mg via INTRAVENOUS
  Filled 2019-10-17 (×5): qty 2

## 2019-10-17 MED ORDER — LIDOCAINE 5 % EX PTCH
1.0000 | MEDICATED_PATCH | CUTANEOUS | Status: DC
  Administered 2019-10-17 – 2019-10-18 (×2): 1 via TRANSDERMAL
  Filled 2019-10-17 (×2): qty 1

## 2019-10-17 MED ORDER — FAMOTIDINE IN NACL 20-0.9 MG/50ML-% IV SOLN
20.0000 mg | Freq: Two times a day (BID) | INTRAVENOUS | Status: DC
  Administered 2019-10-17 – 2019-10-18 (×2): 20 mg via INTRAVENOUS
  Filled 2019-10-17 (×3): qty 50

## 2019-10-17 MED ORDER — MORPHINE SULFATE (PF) 4 MG/ML IV SOLN
4.0000 mg | INTRAVENOUS | Status: DC | PRN
  Administered 2019-10-17: 4 mg via INTRAVENOUS
  Filled 2019-10-17: qty 1

## 2019-10-17 MED ORDER — MORPHINE SULFATE (PF) 2 MG/ML IV SOLN: 2.0000 mg | INTRAVENOUS | Status: DC | PRN

## 2019-10-17 NOTE — Anesthesia Postprocedure Evaluation (Signed)
Anesthesia Post Note  Patient: Courtney Cruz  Procedure(s) Performed: HYSTERECTOMY ABDOMINAL WITH SALPINGECTOMY (Bilateral Abdomen) LAPAROTOMY EXPLORATORY (N/A Abdomen)  Patient location during evaluation: PACU Anesthesia Type: General Level of consciousness: awake and alert Pain management: pain level controlled Vital Signs Assessment: post-procedure vital signs reviewed and stable Respiratory status: spontaneous breathing, nonlabored ventilation, respiratory function stable and patient connected to nasal cannula oxygen Cardiovascular status: blood pressure returned to baseline and stable Postop Assessment: no apparent nausea or vomiting Anesthetic complications: no   No complications documented.   Last Vitals:  Vitals:   10/16/19 2302 10/17/19 0337  BP: 111/76 (!) 110/53  Pulse: 77 78  Resp: 20 18  Temp: 37.2 C 37 C  SpO2: 94% 97%    Last Pain:  Vitals:   10/17/19 0726  TempSrc:   PainSc: Asleep                 Precious Haws Camesha Farooq

## 2019-10-17 NOTE — Progress Notes (Signed)
Subjective:   Called by nursing for second episode of emesis. Patient reports pain across her upper abdomen. She has not tolerated an oral diet. Minimal ambulation to toilet.  Objective:   Blood pressure (!) 143/72, pulse 78, temperature 99.5 F (37.5 C), temperature source Oral, resp. rate 16, last menstrual period 09/28/2019, SpO2 98 %.  General: NAD Pulmonary: no increased work of breathing Abdomen: Distended, tender, absent bowel sounds Incision: clean, dry, intact  Extremities: no edema, no erythema, no tenderness, no signs of DVT  Results for orders placed or performed during the hospital encounter of 10/16/19 (from the past 72 hour(s))  Pregnancy, urine POC     Status: None   Collection Time: 10/16/19  7:10 AM  Result Value Ref Range   Preg Test, Ur NEGATIVE NEGATIVE    Comment:        THE SENSITIVITY OF THIS METHODOLOGY IS >24 mIU/mL   Surgical pathology     Status: None   Collection Time: 10/16/19  9:42 AM  Result Value Ref Range   SURGICAL PATHOLOGY      SURGICAL PATHOLOGY CASE: [id number] PATIENT: [facility] Report     Specimen Submitted: A. Uterus, cervix B. Omentum C. Fallopian tube, right D. Fallopian tube, left  Clinical History: Cervical mass N88.8, menorrhagia w/ regular cycle N92.0, uterine mass N95.8      DIAGNOSIS: A. UTERUS WITH CERVIX; TOTAL HYSTERECTOMY: - CERVIX:      - FOCAL FIBROTIC CHANGES WITH ABUNDANT MUCOID MATERIAL.      - NEGATIVE FOR DYSPLASIA AND MALIGNANCY. - ENDOMETRIUM:      - SECRETORY.  NEGATIVE FOR ATYPIA / EIN AND MALIGNANCY. - MYOMETRIUM:      - ADENOMYOSIS.  LEIOMYOMA.  NEGATIVE FOR MALIGNANCY. - UTERINE SEROSA:      - DENSE FIBROUS ADHESIONS WITH PERITONEAL INCLUSION CYSTS.      - NEGATIVE FOR MALIGNANCY.  B. OMENTUM; BIOPSY:      - UNREMARKABLE FIBROADIPOSE TISSUE.  NEGATIVE FOR MALIGNANCY.  C. FALLOPIAN TUBE, RIGHT; SALPINGECTOMY:      - PARATUBAL CYST.  NEGATIVE FOR  MALIGNANCY.  D. FALLOPIAN TUBE, LEFT; SALPINGECTOMY:      - MULLERIAN  INCLUSION CYST.  NEGATIVE FOR MALIGNANCY.  GROSS DESCRIPTION: Intraoperative Consultation:     Labeled: Uterus and cervix     Received: Fresh     Specimen: Hysterectomy     Pathologic evaluation performed: Frozen section diagnosis     Diagnosis: FS: Cervix unremarkable on representative section. No evidence of malignancy.     Communicated to: Called to Dr. Glennon Mac and [facility] at 10:58 AM on 10/16/2019 Betsy Pries M.D.     Tissue submitted: A representative section of the cervix is frozen as FSA1 and three frozen section slides are performed.  A. Labeled: Uterus and cervix Received: Fresh Weight: 201.94 grams Dimensions:      Fundus -10.6 (superior to inferior) x 8.7 (cornu to cornu) x 5.4 (anterior to posterior) cm      Cervix -3.5 x 3 cm Serosa: The serosa is tan-pink, smooth, and glistening with an ill-defined 7 x 4.5 cm area of roughening on the right anterior body and fundus. Additionally, there are multiple areas of fine presumed adhesions with cysts o n the anterior body. The cysts range from 0.5 to 0.7 cm in greatest dimension. Cervix: The cervix is tan-white, smooth, and pearly with a focal area of hemorrhage at 9-12:00. Endocervix: The endocervix is tan and mucoid with a 3 x 0.8 cm area of  fibrotic roughening. Adjacent to this area there is a 2.5 x 1.5 x 1.3 cm area of endocervical wall that slightly protrudes into the endocervical cavity. Within this area there is a 0.6 x 0.4 x 0.4 cm unilocular cyst which contains clear gelatinous contents. Endometrial cavity:      Dimensions -6.2 (superior to inferior) x 4.4 (cornu to cornu) cm      Thickness -Ranges from 0.1-0.3 cm      Other findings -the endometrium is tan-red, finely granular, and focally lush. Myometrium:     Thickness -2.6 cm     Other findings -the myometrium is tan-pink with scattered areas of trabeculation. There are 2  intramural nodules, 1.2 x 0.6 x 0.6 cm and 1.3 x 0.8 x 0.8 cm. These nodules have tan-white, whorled, firm, and well-circumscribed c ut surfaces. The myometrium becomes thicker and denser at the lower uterine segment. Other comments: None grossly appreciated.  Block summary: 1 - FSA1, representative section of protruding endocervical wall 2 - representative anterior cervix/endocervix with endocervical fibrotic roughening and cervical hemorrhage 3 - representative posterior cervix/endocervix with endocervical fibrotic roughening 4 - 6 - representative sections of protruding endocervical wall (1/cm) 7 - representative anterior transmural endomyometrium 8 - 9 - representative posterior transmural endomyometrium (each bisected) 10 - representative roughened serosa and serosal adhesions with cysts 11 - representative intramural nodules 12 - representative trabeculated myometrium  B. Labeled: Omentum Received: Formalin Tissue fragment(s): 1 Size: 13.2 x 5.2 x 0.9 cm Description: Received is a portion of yellow lobulated adipose tissue admixed with tan-pink membranous tissue. No distinct masses or le sions are grossly identified. Representative sections are submitted in cassettes 1-5.  C. Labeled: Right fallopian tube Received: Formalin Size: 6.4 cm long by 0.8 cm in diameter cm Other findings: Received is a fimbriated fallopian tube with a red-purple, smooth, and glistening serosa. There is a 1.4 x 1.3 x 1 cm paratubal cyst. The lumen is pinpoint and patent.  Block summary: 1 - fallopian tube fimbria, bisected and submitted entirely 2 - representative fallopian tube cross-sections  D. Labeled: Left fallopian tube Received: Formalin Size: 6.1 cm long by 0.8 cm in diameter Other findings: The fallopian tube is fimbriated with a tan-purple, smooth, and glistening serosa. There is a 1.4 x 1 x 0.5 cm presumed paratubal cyst. The lumen is pinpoint and patent.  Block summary: 1 -  fallopian tube fimbria, bisected and submitted entirely 2 - representative fallopian tube cross-sections    Final Diagnosis performed by Betsy Pries, MD.   Electronically signed 8 /19/2021 4:31:45PM The electronic signature indicates that the named Attending Pathologist has evaluated the specimen Technical component performed at [facility], [address] [place], [place] [address] Lab: [phone] Dir: Rush Farmer, MD, MMM  Professional component performed at [facility], [facility], [place], [place], [place] [address] Lab: [phone] Dir: Dellia Nims. Rubinas, MD   Basic metabolic panel     Status: Abnormal   Collection Time: 10/16/19  9:06 PM  Result Value Ref Range   Sodium 136 135 - 145 mmol/L   Potassium 4.0 3.5 - 5.1 mmol/L   Chloride 105 98 - 111 mmol/L   CO2 21 (L) 22 - 32 mmol/L   Glucose, Bld 146 (H) 70 - 99 mg/dL    Comment: Glucose reference range applies only to samples taken after fasting for at least 8 hours.   BUN 10 6 - 20 mg/dL   Creatinine, Ser 1.10 (H) 0.44 - 1.00 mg/dL   Calcium  8.6 (L) 8.9 - 10.3 mg/dL   GFR calc non Af Amer 58 (L) >60 mL/min   GFR calc Af Amer >60 >60 mL/min   Anion gap 10 5 - 15    Comment: Performed at [facility], [address] [place], [place] [address]  CBC     Status: Abnormal   Collection Time: 10/16/19  9:06 PM  Result Value Ref Range   WBC 14.8 (H) 4.0 - 10.5 K/uL   RBC 4.40 3.87 - 5.11 MIL/uL   Hemoglobin 10.9 (L) 12.0 - 15.0 g/dL   HCT 32.8 (L) 36 - 46 %   MCV 74.5 (L) 80.0 - 100.0 fL   MCH 24.8 (L) 26.0 - 34.0 pg   MCHC 33.2 30.0 - 36.0 g/dL   RDW 16.5 (H) 11.5 - 15.5 %   Platelets 321 150 - 400 K/uL   nRBC 0.0 0.0 - 0.2 %    Comment: Performed at [facility], [address] [place], [place] [address]  Sodium, urine, random     Status: None   Collection Time: 10/16/19 10:09 PM  Result Value Ref Range   Sodium, Ur 45 mmol/L    Comment: Performed at [facility], [address] [place], [place] [address]  Creatinine, urine, random     Status: None   Collection Time: 10/16/19 10:09 PM  Result Value Ref Range   Creatinine, Urine 201 mg/dL    Comment: Performed at [facility], [address] [place], [place] [address]  CBC     Status: Abnormal   Collection Time: 10/17/19  6:29 AM  Result Value Ref Range   WBC 10.9 (H) 4.0 - 10.5 K/uL   RBC 3.99 3.87 - 5.11 MIL/uL   Hemoglobin 9.7 (L) 12.0 - 15.0 g/dL   HCT 30.9 (L) 36 - 46 %   MCV 77.4 (L) 80.0 - 100.0 fL   MCH 24.3 (L) 26.0 - 34.0 pg   MCHC 31.4 30.0 - 36.0 g/dL   RDW 16.6 (H) 11.5 - 15.5 %   Platelets 283 150 - 400 K/uL   nRBC 0.0 0.0 - 0.2 %    Comment: Performed at [facility], [address], [place], [place] [address]  Basic metabolic panel     Status: Abnormal   Collection Time: 10/17/19  6:29 AM  Result Value Ref Range   Sodium 138 135 - 145 mmol/L   Potassium 3.6 3.5 - 5.1 mmol/L   Chloride 105 98 - 111 mmol/L   CO2 23 22 - 32 mmol/L   Glucose, Bld 127 (H) 70 - 99 mg/dL    Comment: Glucose reference range applies only to samples taken after fasting for at least 8 hours.   BUN 9 6 - 20 mg/dL   Creatinine, Ser 1.01 (H) 0.44 - 1.00 mg/dL   Calcium 8.4 (L) 8.9 - 10.3 mg/dL   GFR calc non Af Amer >60 >60 mL/min   GFR calc Af Amer >60 >60 mL/min   Anion gap 10 5 - 15    Comment: Performed at [facility], [address], [place],  [address]    Assessment:   49 y.o. s/p TAH Postoperative Day #1  Plan:    1) Ileus- NPO, convert to IV meds. Reglan 10 mg IV QID. IV fluids. If emesis continues consider NG tube.   2) Pain control- lidocaine patch, IV morphine- encouraged patient to minimize narcotic pain medication to help with Ileus   3)  Encouraged Ambulation  4) DVT/GI prophylaxis- Pepcid, lovenox, SCD  5) Check AM labs  6) Disposition- continue care, not stable for discharge home today.    Adrian Prows MD,  [facility], [facility] 10/17/2019 6:17 PM

## 2019-10-17 NOTE — Progress Notes (Signed)
Daily Benign Gynecology Progress Note Courtney Cruz  [id number]  POD#1  Chief Complaint: Post op care after surgery  Subjective:  Overnight Events: low urine output, FENa indicating pre-renal.  Small bump in creatinine.  S/P IVF bolus with improved urine output and drop this morning in creatinine.  Complaints: nausea with a couple of episodes of emesis.  She denies: fevers, chills, chest pain, trouble breathing, severe abdominal pain.  She has tolerated: mostly water. She reports her pain is well controlled with IV dilaudid.  Her IV toradol was discontinued due to bump in creatinine overnight..   She is not ambulating and is not voiding spontaneously both due to the presence of her catheter, which is still in place.  She has not passed flatus.   Objective:  Most recent vitals Temp: 98.6 F (37 C)  BP: (!) 110/53  Pulse Rate: 78  Resp: 18  SpO2: 97 %   Vitals Range over 24 hours Temp  Avg: 98.6 F (37 C)  Min: 98.1 F (36.7 C)  Max: 99 F (37.2 C) BP  Min: 101/60  Max: 141/94 Pulse  Avg: 77.5  Min: 65  Max: 88 SpO2  Avg: 96.3 %  Min: 93 %  Max: 100 %   Urine Output: 440 mL over 7.5 hours (~19mL/hr).    Physical Exam Vitals and nursing note reviewed.  Constitutional:      General: She is not in acute distress.    Appearance: Normal appearance.  HENT:     Head: Normocephalic and atraumatic.  Cardiovascular:     Rate and Rhythm: Normal rate and regular rhythm.     Heart sounds: No murmur heard.  No friction rub. No gallop.   Pulmonary:     Effort: Pulmonary effort is normal.     Breath sounds: Normal breath sounds. No wheezing, rhonchi or rales.  Abdominal:     General: There is distension (mild distinsion).     Palpations: Abdomen is soft. There is no mass.     Tenderness: There is abdominal tenderness (appopriately tender to palpation). There is no guarding or rebound.     Comments: Incision with honeycomb bandage in place. One small area with old-appearing blood.  Not  soaked.  Musculoskeletal:        General: No swelling or tenderness. Normal range of motion.     Right lower leg: No edema.     Left lower leg: No edema.     Comments: SCDs in place  Skin:    General: Skin is warm and dry.  Neurological:     General: No focal deficit present.     Mental Status: She is alert and oriented to person, place, and time.     Cranial Nerves: No cranial nerve deficit.  Psychiatric:        Mood and Affect: Mood normal.        Behavior: Behavior normal.        Judgment: Judgment normal.      AM Labs Lab Results  Component Value Date   WBC 10.9 (H) 10/17/2019   HGB 9.7 (L) 10/17/2019   HCT 30.9 (L) 10/17/2019   PLT 283 10/17/2019   NA 138 10/17/2019   K 3.6 10/17/2019   CREATININE 1.01 (H) 10/17/2019   BUN 9 10/17/2019     Assessment:  Courtney Cruz is a 49 y.o. female POD#1 s/p TAH/Bilateral salpingectomy, with some mild nausea and decreased urine output overnight which has responded well to an IVF bolus.  Plan:  Pain Control:  Continue IV medication until tolerating PO. Stopped Toradol due to bump in creatinine and decreased urine out put.  May change IV pain medication to see if this help with nausea.   Pulmonary:  Incentive spirometry, turn, cough, ambulate some today Heme: mild anemia. Drop in hemoglobin not greater than expected.   ID: no issues GI: advanced diet as tolerated. IV zofran for nausea.  May add phenergan so as not to constipate her too much.  GU:  D/c foley today Nutrition:  Increase diet slowly today with nausea.   IVF: currently at 3mL/hour.  Will increase to 100 ml/hour to help with slow return to tolerating PO. DVT Prophylaxis:  SCDs Activity: out of bed today and ambulate once foley catheter is out Anticipated Discharge: POD#2-3.  Prentice Docker, MD  10/17/2019 8:38 AM

## 2019-10-17 NOTE — Progress Notes (Signed)
Pt continued to have n&v despite nausea meds, and still not tolerating clear liquids. Pt has increase in abd pain from vomiting. Pt has temp of 99.5, Dr Glennon Mac called and xray ordered and consult to Dr. Gilman Schmidt.

## 2019-10-17 NOTE — Progress Notes (Addendum)
   10/17/19 1215  Clinical Encounter Type  Visited With Patient  Visit Type Follow-up  Referral From Chaplain  Consult/Referral To Chaplain  When chaplain entered the room, she saw patient was sleep, but patient woke up. Chaplain asked how she was feeling and she said she has been sick on her stomach and in pain. Chaplain asked if she has had anything for pain and patient said yes. Chaplain made the visit short since patient was trying to sleep.

## 2019-10-18 LAB — CBC
HCT: 30.7 % — ABNORMAL LOW (ref 36.0–46.0)
Hemoglobin: 9.8 g/dL — ABNORMAL LOW (ref 12.0–15.0)
MCH: 24.6 pg — ABNORMAL LOW (ref 26.0–34.0)
MCHC: 31.9 g/dL (ref 30.0–36.0)
MCV: 77.1 fL — ABNORMAL LOW (ref 80.0–100.0)
Platelets: 289 10*3/uL (ref 150–400)
RBC: 3.98 MIL/uL (ref 3.87–5.11)
RDW: 17 % — ABNORMAL HIGH (ref 11.5–15.5)
WBC: 7.3 10*3/uL (ref 4.0–10.5)
nRBC: 0 % (ref 0.0–0.2)

## 2019-10-18 LAB — BASIC METABOLIC PANEL
Anion gap: 7 (ref 5–15)
BUN: 6 mg/dL (ref 6–20)
CO2: 23 mmol/L (ref 22–32)
Calcium: 8.2 mg/dL — ABNORMAL LOW (ref 8.9–10.3)
Chloride: 111 mmol/L (ref 98–111)
Creatinine, Ser: 0.91 mg/dL (ref 0.44–1.00)
GFR calc Af Amer: >60 mL/min (ref >60)
GFR calc non Af Amer: >60 mL/min (ref >60)
Glucose, Bld: 135 mg/dL — ABNORMAL HIGH (ref 70–99)
Potassium: 3.3 mmol/L — ABNORMAL LOW (ref 3.5–5.1)
Sodium: 141 mmol/L (ref 135–145)

## 2019-10-18 MED ORDER — METOCLOPRAMIDE HCL 10 MG PO TABS
10.0000 mg | ORAL_TABLET | Freq: Three times a day (TID) | ORAL | Status: DC
  Administered 2019-10-19: 10 mg via ORAL
  Filled 2019-10-18: qty 1

## 2019-10-18 MED ORDER — ACETAMINOPHEN 500 MG PO TABS
1000.0000 mg | ORAL_TABLET | Freq: Four times a day (QID) | ORAL | Status: DC | PRN
  Administered 2019-10-18 – 2019-10-19 (×4): 1000 mg via ORAL
  Filled 2019-10-18 (×4): qty 2

## 2019-10-18 MED ORDER — FAMOTIDINE 20 MG PO TABS
20.0000 mg | ORAL_TABLET | Freq: Two times a day (BID) | ORAL | Status: DC
  Administered 2019-10-18 – 2019-10-19 (×2): 20 mg via ORAL
  Filled 2019-10-18 (×2): qty 1

## 2019-10-18 NOTE — Progress Notes (Signed)
Daily Benign Gynecology Progress Note LULAMAE SKORUPSKI  [id number]  POD#2   Chief Complaint: Postop care follow total abdominal hysterectomy Subjective:  Overnight Events: no acute events Complaints: none.  She had an increase in nausea and vomiting yesterday.  She was made NPO, had an abdominal xray that showed possible ileus, no retained sponges.  She has done well since then. She has ambulated more.  She continues to have eructation.   She denies: fevers, chills, chest pain, trouble breathing, nausea, vomiting, severe abdominal pain.  She has tolerated: NPO She reports her pain is well controlled with IV pain medication.   She is ambulating and is voiding.  She has not passed flatus.   Objective:  Most recent vitals Temp: 98.6 F (37 C)  BP: 123/61  Pulse Rate: 76  Resp: 18  SpO2: 96 %   Vitals Range over 24 hours Temp  Avg: 99 F (37.2 C)  Min: 98.6 F (37 C)  Max: 99.5 F (37.5 C) BP  Min: 118/61  Max: 143/72 Pulse  Avg: 75.7  Min: 72  Max: 78 SpO2  Avg: 96.5 %  Min: 96 %  Max: 98 %   Urine Output: 2.2025 mL over the past 24 hours, 500 mL over 8 hours.    Physical Exam Constitutional:      General: She is not in acute distress.    Appearance: Normal appearance.  HENT:     Head: Normocephalic and atraumatic.  Eyes:     General: No scleral icterus.    Conjunctiva/sclera: Conjunctivae normal.  Cardiovascular:     Rate and Rhythm: Normal rate and regular rhythm.     Heart sounds: No murmur heard.  No friction rub. No gallop.   Pulmonary:     Effort: Pulmonary effort is normal. No respiratory distress.     Breath sounds: Normal breath sounds. No wheezing, rhonchi or rales.  Abdominal:     General: There is distension (moderate).     Palpations: Abdomen is soft. There is no mass.     Tenderness: There is no abdominal tenderness. There is no guarding or rebound.     Comments: Scant bowel sounds, Incision is clean, dry, intact.   Abdominal binder in place. Removed to  inspect and examine abdomen.  Musculoskeletal:        General: No swelling. Normal range of motion.  Skin:    General: Skin is warm and dry.     Coloration: Skin is not jaundiced.  Neurological:     General: No focal deficit present.     Mental Status: She is alert and oriented to person, place, and time.     Cranial Nerves: No cranial nerve deficit.  Psychiatric:        Mood and Affect: Mood normal.        Behavior: Behavior normal.        Judgment: Judgment normal.      AM Labs Lab Results  Component Value Date   WBC 7.3 10/18/2019   HGB 9.8 (L) 10/18/2019   HCT 30.7 (L) 10/18/2019   PLT 289 10/18/2019   NA 141 10/18/2019   K 3.3 (L) 10/18/2019   CREATININE 0.91 10/18/2019   BUN 6 10/18/2019    Imaging Results DG Abd 2 Views  Result Date: 10/17/2019 CLINICAL DATA:  49 year old female with nausea vomiting. Status post hysterectomy. EXAM: ABDOMEN - 2 VIEW COMPARISON:  CT abdomen pelvis dated 09/10/2019. FINDINGS: Several mildly dilated air-filled loops of small bowel measure up  to 3.2 cm in caliber. Air is noted within the colon. No large free air identified. Small pocket of pneumoperitoneum may be present along the undersurface of the liver, likely postoperative. No radiopaque calculi. The osseous structures are intact. Midline vertical anterior pelvic wall cutaneous staples. IMPRESSION: Probable postoperative ileus. Developing obstruction is not excluded. Follow-up recommended. Electronically Signed   By: Anner Crete M.D.   On: 10/17/2019 17:39     Assessment:  HERMELINDA DIEGEL is a 49 y.o. female POD#2 s/p Total abdominal hysterectomy, bilateral salpingectomy.  She has what appears at this time to be a postoperative ileus.    Plan:  Pain Control:  IV pain medication until tolerating PO Pulmonary:  Incentive spirometry Heme: blood counts stable ID: no issues GI: Ileus.  Continue reglan, simethicone, NPO for now, encourage ambulation. Consider NG tube, if emesis  starts again.   GU:  Adequate urine output Nutrition:  NPO,  Advance when able IVF: D5 1/2 NS with KCl at 125 ml/hour DVT Prophylaxis:  SCDs and lovenox Activity: as tolerated Anticipated Discharge: once tolerating PO  Prentice Docker, MD  10/18/2019 8:04 AM

## 2019-10-18 NOTE — Progress Notes (Signed)
In to see patient. RN at bedside.   Patient just reported passing flatus and has had two bowel movements.  Will slowly advance diet to now clears with hopefully adding gently solids slowly. Continue IVF at current rate for now Change meds to PO when possible.  Otherwise, continue current care.  Plan for discharge tomorrow, if able. Otherwise, Sunday, if continues to improve.  Prentice Docker, MD, [facility], [facility] 10/18/2019 6:20 PM

## 2019-10-19 MED ORDER — ACETAMINOPHEN 500 MG PO TABS: 1000.0000 mg | ORAL_TABLET | Freq: Four times a day (QID) | ORAL | 0 refills | Status: DC | PRN

## 2019-10-19 MED ORDER — METOCLOPRAMIDE HCL 10 MG PO TABS: 10.0000 mg | ORAL_TABLET | Freq: Three times a day (TID) | ORAL | Status: DC | PRN

## 2019-10-19 MED ORDER — ONDANSETRON HCL 4 MG PO TABS: 4.0000 mg | ORAL_TABLET | Freq: Four times a day (QID) | ORAL | 0 refills | Status: DC | PRN

## 2019-10-19 MED ORDER — SIMETHICONE 80 MG PO CHEW: 80.0000 mg | CHEWABLE_TABLET | Freq: Four times a day (QID) | ORAL | 0 refills | Status: DC

## 2019-10-19 NOTE — Discharge Summary (Signed)
Physician Discharge Summary  Patient ID: Courtney Cruz MRN: [id number] DOB/AGE: 08/20/1970 49 y.o.  Admit date: 10/16/2019 Discharge date: 10/19/2019  Admission Diagnoses: Cervical mass  Discharge Diagnoses:  Principal Problem:   Cervical mass Active Problems:   Menorrhagia   Uterine mass   Status post abdominal hysterectomy   Discharged Condition: good  Hospital Course: Patient was admitted and underwent a total abdominal hysterectomy, bilateral salpingectomy and omental biopsy for a cervical mass which was benign.  Patient's postoperative recovery was complicated by ileus which resolved with medication and ambulation.  On her day of discharge she was able to tolerate a soft diet.  Her pain was controlled with Tylenol.  And she is discharged home in stable condition.  Consults: None  Significant Diagnostic Studies: labs: See Epic  Treatments: IV hydration and surgery: Hysterectomy  Discharge Exam: Blood pressure (!) 141/95, pulse 78, temperature 98.9 F (37.2 C), temperature source Oral, resp. rate 18, last menstrual period 09/28/2019, SpO2 99 %. General appearance: alert, cooperative and appears stated age 30: regular rate and rhythm, S1, S2 normal, no murmur, click, rub or gallop GI: soft, non-tender; bowel sounds normal; no masses,  no organomegaly Extremities: extremities normal, atraumatic, no cyanosis or edema Skin: Skin color, texture, turgor normal. No rashes or lesions  Disposition: Discharge disposition: 01-Home or Self Care       Discharge Instructions    Call MD for:  difficulty breathing, headache or visual disturbances   Complete by: As directed    Call MD for:  extreme fatigue   Complete by: As directed    Call MD for:  hives   Complete by: As directed    Call MD for:  persistant dizziness or light-headedness   Complete by: As directed    Call MD for:  persistant nausea and vomiting   Complete by: As directed    Call MD for:  redness,  tenderness, or signs of infection (pain, swelling, redness, odor or green/yellow discharge around incision site)   Complete by: As directed    Call MD for:  severe uncontrolled pain   Complete by: As directed    Call MD for:  temperature >100.4   Complete by: As directed    Diet general   Complete by: As directed    Driving Restrictions   Complete by: As directed    Do not drive while taking narcotic medications   Increase activity slowly   Complete by: As directed    May shower / Bathe   Complete by: As directed      Allergies as of 10/19/2019      Reactions   Sumatriptan Anaphylaxis, Shortness Of Breath   Latex Rash      Medication List    TAKE these medications   acetaminophen 500 MG tablet Commonly known as: TYLENOL Take 2 tablets (1,000 mg total) by mouth every 6 (six) hours as needed for mild pain.   ferrous sulfate 325 (65 FE) MG EC tablet Take 325 mg by mouth every morning.   ibuprofen 600 MG tablet Commonly known as: ADVIL Take 1 tablet (600 mg total) by mouth every 8 (eight) hours as needed for mild pain.   ondansetron 4 MG disintegrating tablet Commonly known as: Zofran ODT Take 1 tablet (4 mg total) by mouth every 8 (eight) hours as needed for nausea or vomiting.   ondansetron 4 MG tablet Commonly known as: ZOFRAN Take 1 tablet (4 mg total) by mouth every 6 (six) hours as needed for  nausea.   oxyCODONE-acetaminophen 5-325 MG tablet Commonly known as: Percocet Take 1-2 tablets by mouth every 6 (six) hours as needed for moderate pain or severe pain.   simethicone 80 MG chewable tablet Commonly known as: MYLICON Chew 1 tablet (80 mg total) by mouth 4 (four) times daily.       Follow-up Information    Will Bonnet, MD. Schedule an appointment as soon as possible for a visit in 1 week.   Specialty: Obstetrics and Gynecology Why: For incision check, staple removal Contact information: [place] [address] [phone]                Signed: Homero Fellers 10/19/2019, 1:48 PM

## 2019-10-19 NOTE — Progress Notes (Signed)
Patient discharged home. Discharge instructions and prescriptions given and reviewed with patient. Patient verbalized understanding.   Encouraged pt to call ASAP on Monday morning and schedule a post-op incision check and dressing removal with Dr. Glennon Mac for sometime next week.   Went over incision care and infection prevention in detail with pt. Provided incision care kit and went over how to use.

## 2019-10-25 ENCOUNTER — Other Ambulatory Visit: Payer: Self-pay

## 2019-10-25 ENCOUNTER — Encounter: Payer: Self-pay | Admitting: Obstetrics and Gynecology

## 2019-10-25 ENCOUNTER — Ambulatory Visit (INDEPENDENT_AMBULATORY_CARE_PROVIDER_SITE_OTHER): Payer: Self-pay | Admitting: Obstetrics and Gynecology

## 2019-10-25 VITALS — BP 120/70 | Ht 59.0 in | Wt 197.0 lb

## 2019-10-25 DIAGNOSIS — Z09 Encounter for follow-up examination after completed treatment for conditions other than malignant neoplasm: Secondary | ICD-10-CM

## 2019-10-25 MED ORDER — FLUCONAZOLE 150 MG PO TABS: 150.0000 mg | ORAL_TABLET | Freq: Once | ORAL | 1 refills | Status: AC

## 2019-10-25 MED ORDER — CEPHALEXIN 500 MG PO TABS: 500.0000 mg | ORAL_TABLET | Freq: Four times a day (QID) | ORAL | 0 refills | Status: AC

## 2019-10-25 NOTE — Progress Notes (Signed)
   Postoperative Follow-up Patient presents post op from TAH/BS 8 days ago for cervical mass.  Subjective: Patient reports marked improvement in her preop symptoms. Eating a regular diet without difficulty. Pain is controlled with current analgesics. Medications being used: acetaminophen, ibuprofen (OTC) and narcotic analgesics including oxycodone/acetaminophen (Percocet, Tylox).  Activity: increasing slowly.  Denies fevers, chills, nausea, vomiting..  Pathology report Specimen Submitted:  A. Uterus, cervix  B. Omentum  C. Fallopian tube, right  D. Fallopian tube, left   Clinical History: Cervical mass N88.8, menorrhagia w/ regular cycle  N92.0, uterine mass N95.8   DIAGNOSIS:  A. UTERUS WITH CERVIX; TOTAL HYSTERECTOMY:  - CERVIX:    - FOCAL FIBROTIC CHANGES WITH ABUNDANT MUCOID MATERIAL.    - NEGATIVE FOR DYSPLASIA AND MALIGNANCY.  - ENDOMETRIUM:    - SECRETORY. NEGATIVE FOR ATYPIA / EIN AND MALIGNANCY.  - MYOMETRIUM:    - ADENOMYOSIS. LEIOMYOMA. NEGATIVE FOR MALIGNANCY.  - UTERINE SEROSA:    - DENSE FIBROUS ADHESIONS WITH PERITONEAL INCLUSION CYSTS.    - NEGATIVE FOR MALIGNANCY.   B. OMENTUM; BIOPSY:    - UNREMARKABLE FIBROADIPOSE TISSUE. NEGATIVE FOR MALIGNANCY.   C. FALLOPIAN TUBE, RIGHT; SALPINGECTOMY:    - PARATUBAL CYST. NEGATIVE FOR MALIGNANCY.   D. FALLOPIAN TUBE, LEFT; SALPINGECTOMY:    - MULLERIANINCLUSION CYST. NEGATIVE FOR MALIGNANCY.   Objective: Vitals:   10/25/19 1059  BP: 120/70   Vital Signs: BP 120/70   Ht 4\' 11"  (1.499 m)   Wt 197 lb (89.4 kg)   LMP 09/28/2019   BMI 39.79 kg/m  Constitutional: Well nourished, well developed female in no acute distress.  HEENT: normal Skin: Warm and dry.  Extremity: no edema  Abdomen: Soft, non-tender, normal bowel sounds; no bruits, organomegaly or masses. Bandage and staple removed.  Lower portion has erythema extending from the fold of her pannus.  There appears to be  central clearing.  This area is more tender without induration. There is mild warmth. The closure is intact without leaking any purulent material.  The rest of the incision is without erythema, induration, warmth, and tenderness.   The incision is reinforced with steristrips and benzoin.  Pelvic exam: deferred.     Assessment: 49 y.o. s/p TAH/BS progressing well, she appears to have a cutaneous candiditis   Plan: Patient has done well after surgery with no apparent complications.  I have discussed the post-operative course to date, and the expected progress moving forward.  The patient understands what complications to be concerned about.  I will see the patient in routine follow up, or sooner if needed.    Activity plan: increase very slowly. DIflucan x 2 doses for her candidiasis.  Wound care teaching performed.  Keflex to prevent secondary infection.  Return in about 1 week (around 11/01/2019) for Post op incision check.   Prentice Docker 10/25/2019, 11:19 AM

## 2019-11-01 ENCOUNTER — Encounter: Payer: Self-pay | Admitting: Obstetrics and Gynecology

## 2019-11-01 ENCOUNTER — Other Ambulatory Visit: Payer: Self-pay

## 2019-11-01 ENCOUNTER — Ambulatory Visit (INDEPENDENT_AMBULATORY_CARE_PROVIDER_SITE_OTHER): Payer: Self-pay | Admitting: Obstetrics and Gynecology

## 2019-11-01 VITALS — BP 126/84 | Ht 59.0 in | Wt 192.0 lb

## 2019-11-01 DIAGNOSIS — Z09 Encounter for follow-up examination after completed treatment for conditions other than malignant neoplasm: Secondary | ICD-10-CM

## 2019-11-01 DIAGNOSIS — N888 Other specified noninflammatory disorders of cervix uteri: Secondary | ICD-10-CM

## 2019-11-01 NOTE — Progress Notes (Signed)
   Postoperative Follow-up Patient presents post op from TAH/BS 16  days ago for cervical mass.  Subjective: Patient reports marked improvement in her preop symptoms. Eating a regular diet without difficulty. Pain is controlled with current analgesics. Medications being used: acetaminophen, ibuprofen (OTC) and narcotic analgesics including oxycodone/acetaminophen (Percocet, Tylox).  Activity: increasing slowly.  Denies fevers, chills, nausea, vomiting..  Pathology report Specimen Submitted:  A. Uterus, cervix  B. Omentum  C. Fallopian tube, right  D. Fallopian tube, left   Clinical History: Cervical mass N88.8, menorrhagia w/ regular cycle  N92.0, uterine mass N95.8   DIAGNOSIS:  A. UTERUS WITH CERVIX; TOTAL HYSTERECTOMY:  - CERVIX:    - FOCAL FIBROTIC CHANGES WITH ABUNDANT MUCOID MATERIAL.    - NEGATIVE FOR DYSPLASIA AND MALIGNANCY.  - ENDOMETRIUM:    - SECRETORY. NEGATIVE FOR ATYPIA / EIN AND MALIGNANCY.  - MYOMETRIUM:    - ADENOMYOSIS. LEIOMYOMA. NEGATIVE FOR MALIGNANCY.  - UTERINE SEROSA:    - DENSE FIBROUS ADHESIONS WITH PERITONEAL INCLUSION CYSTS.    - NEGATIVE FOR MALIGNANCY.   B. OMENTUM; BIOPSY:    - UNREMARKABLE FIBROADIPOSE TISSUE. NEGATIVE FOR MALIGNANCY.   C. FALLOPIAN TUBE, RIGHT; SALPINGECTOMY:    - PARATUBAL CYST. NEGATIVE FOR MALIGNANCY.   D. FALLOPIAN TUBE, LEFT; SALPINGECTOMY:    - MULLERIANINCLUSION CYST. NEGATIVE FOR MALIGNANCY.   Objective: Vitals:   11/01/19 0914  BP: 126/84   Vital Signs: BP 126/84   Ht 4\' 11"  (1.499 m)   Wt 192 lb (87.1 kg)   BMI 38.78 kg/m  Constitutional: Well nourished, well developed female in no acute distress.  HEENT: normal Skin: Warm and dry.  Extremity: no edema  Abdomen: Soft, non-tender, normal bowel sounds; no bruits, organomegaly or masses. Bandage and staple removed.  Lower portion has erythema extending from the fold of her pannus is improved. There is no erythema, warmth,  tenderness.  There is mild induration along the fascial closure, difficult to distinguish fascial closure from induration.  The incision is otherwise clean, dry, and intact.    Pelvic exam: deferred.     Assessment: 49 y.o. s/p TAH/BS progressing well, her incisional candidiasis has resolved.    Plan: Patient has done well after surgery with no apparent complications.  I have discussed the post-operative course to date, and the expected progress moving forward.  The patient understands what complications to be concerned about.  I will see the patient in routine follow up, or sooner if needed.    Activity plan: increase very slowly. Incision care instructions from this point forward reviewed, along with precautions for infection.   Return in about 4 weeks (around 11/29/2019) for Six week post op visit.   Prentice Docker 11/01/2019, 9:32 AM

## 2019-11-29 ENCOUNTER — Other Ambulatory Visit: Payer: Self-pay

## 2019-11-29 ENCOUNTER — Ambulatory Visit (INDEPENDENT_AMBULATORY_CARE_PROVIDER_SITE_OTHER): Payer: Self-pay | Admitting: Obstetrics and Gynecology

## 2019-11-29 ENCOUNTER — Encounter: Payer: Self-pay | Admitting: Obstetrics and Gynecology

## 2019-11-29 VITALS — BP 120/80 | Ht 59.0 in | Wt 193.0 lb

## 2019-11-29 DIAGNOSIS — Z09 Encounter for follow-up examination after completed treatment for conditions other than malignant neoplasm: Secondary | ICD-10-CM

## 2019-11-29 NOTE — Progress Notes (Signed)
   Postoperative Follow-up Patient presents post op from Total abdominal hysterectomy, bilateral salpingectomy, omental biopsy 6 weeks ago for cervical mass.  Subjective: Patient reports marked improvement in her preop symptoms. Eating a regular diet without difficulty. The patient is not having any pain.  Activity: normal activities of daily living.  Objective: Vitals:   11/29/19 0809  BP: 120/80   Vital Signs: BP 120/80   Ht 4\' 11"  (1.499 m)   Wt 193 lb (87.5 kg)   LMP 09/28/2019   BMI 38.98 kg/m  Constitutional: Well nourished, well developed female in no acute distress.  HEENT: normal Skin: Warm and dry.  Extremity: no edema  Abdomen: Soft, non-tender, normal bowel sounds; no bruits, organomegaly or masses. clean, dry, intact and without erythema, induration, and tenderness  Pelvic exam: (female chaperone present) is not limited by body habitus EGBUS: within normal limits Vagina: within normal limits and with vaginal cuff is well healed   Assessment: 49 y.o. s/p TAH/BS progressing well  Plan: Patient has done well after surgery with no apparent complications.  I have discussed the post-operative course to date, and the expected progress moving forward.  The patient understands what complications to be concerned about.  I will see the patient in routine follow up, or sooner if needed.    Activity plan: No restriction.  Prentice Docker, MD 11/29/2019, 8:41 AM

## 2020-12-22 ENCOUNTER — Encounter: Payer: Self-pay | Admitting: Obstetrics and Gynecology

## 2020-12-22 ENCOUNTER — Other Ambulatory Visit: Payer: Self-pay

## 2020-12-22 ENCOUNTER — Ambulatory Visit (INDEPENDENT_AMBULATORY_CARE_PROVIDER_SITE_OTHER): Payer: Self-pay | Admitting: Obstetrics and Gynecology

## 2020-12-22 VITALS — BP 126/84 | Ht 59.0 in | Wt 205.0 lb

## 2020-12-22 DIAGNOSIS — Z01419 Encounter for gynecological examination (general) (routine) without abnormal findings: Secondary | ICD-10-CM

## 2020-12-22 DIAGNOSIS — Z1331 Encounter for screening for depression: Secondary | ICD-10-CM

## 2020-12-22 DIAGNOSIS — Z1339 Encounter for screening examination for other mental health and behavioral disorders: Secondary | ICD-10-CM

## 2020-12-22 NOTE — Progress Notes (Signed)
Routine Annual Gynecology Examination   PCP: Patient, No Pcp Per (Inactive)  Chief Complaint  Patient presents with   Annual Exam   History of Present Illness: Patient is a 50 y.o. [id number] presents for annual exam. The patient has no complaints today.   Menopausal bleeding: absent  Menopausal symptoms: denies She has noted a decrease in libido.   Breast symptoms: denies  Last pap smear: n/a   Last mammogram:  distant. Normal.   Past Medical History:  Diagnosis Date   Anemia    GERD (gastroesophageal reflux disease)    Headache    Uterine fibroid    Past Surgical History:  Procedure Laterality Date   CESAREAN SECTION     HYSTERECTOMY ABDOMINAL WITH SALPINGECTOMY Bilateral 10/16/2019   Procedure: HYSTERECTOMY ABDOMINAL WITH SALPINGECTOMY;  Surgeon: Will Bonnet, MD;  Location: [facility] ORS;  Service: Gynecology;  Laterality: Bilateral;   LAPAROTOMY WITH STAGING N/A 10/16/2019   Procedure: LAPAROTOMY EXPLORATORY;  Surgeon: Will Bonnet, MD;  Location: [facility] ORS;  Service: Gynecology;  Laterality: N/A;   Prior to Admission medications   Medication Sig Start Date End Date Taking? Authorizing Provider  ferrous sulfate 325 (65 FE) MG EC tablet Take 325 mg by mouth every morning.  08/19/19   [provider]    Allergies  Allergen Reactions   Sumatriptan Anaphylaxis and Shortness Of Breath   Latex Rash    Obstetric History: [id number]  Social History   Socioeconomic History   Marital status: Married    Spouse name: Not on file   Number of children: Not on file   Years of education: Not on file   Highest education level: Not on file  Occupational History   Not on file  Tobacco Use   Smoking status: Former   Smokeless tobacco: Never  Vaping Use   Vaping Use: Never used  Substance and Sexual Activity   Alcohol use: Yes    Comment: Occ   Drug use: Never   Sexual activity: Yes    Birth control/protection: None  Other Topics Concern   Not on file   Social History Narrative   Not on file   Social Determinants of Health   Financial Resource Strain: Not on file  Food Insecurity: Not on file  Transportation Needs: Not on file  Physical Activity: Not on file  Stress: Not on file  Social Connections: Not on file  Intimate Partner Violence: Not on file    Family History  Problem Relation Age of Onset   Diabetes Maternal Grandfather    Hypertension Maternal Grandfather    Cancer Neg Hx     Review of Systems  Constitutional: Negative.   HENT: Negative.    Eyes: Negative.   Respiratory: Negative.    Cardiovascular: Negative.   Gastrointestinal: Negative.   Genitourinary: Negative.   Musculoskeletal: Negative.   Skin: Negative.   Neurological: Negative.   Psychiatric/Behavioral: Negative.      Physical Exam Vitals: BP 126/84   Ht 4\' 11"  (1.499 m)   Wt 205 lb (93 kg)   LMP 09/28/2019   BMI 41.40 kg/m   Physical Exam Constitutional:      General: She is not in acute distress.    Appearance: Normal appearance. She is well-developed.  Genitourinary:     Vulva and bladder normal.     Right Labia: No rash, tenderness, lesions, skin changes or Bartholin's cyst.    Left Labia: No tenderness, lesions, skin changes, Bartholin's cyst or rash.  No inguinal adenopathy present in the right or left side.    Pelvic Tanner Score: 5/5.    Vaginal cuff intact.    No vaginal discharge, erythema, tenderness or bleeding.      Right Adnexa: not tender, not full and no mass present.    Left Adnexa: not tender, not full and no mass present.    Cervix is absent.     Uterus is absent.     Pelvic exam was performed with patient in the lithotomy position.  Breasts:    Right: No inverted nipple, mass, nipple discharge, skin change or tenderness.     Left: No inverted nipple, mass, nipple discharge, skin change or tenderness.  HENT:     Head: Normocephalic and atraumatic.  Eyes:     General: No scleral icterus.     Conjunctiva/sclera: Conjunctivae normal.  Neck:     Thyroid: No thyromegaly.  Cardiovascular:     Rate and Rhythm: Normal rate and regular rhythm.     Heart sounds: No murmur heard.   No friction rub. No gallop.  Pulmonary:     Effort: Pulmonary effort is normal. No respiratory distress.     Breath sounds: Normal breath sounds. No wheezing or rales.  Abdominal:     General: Bowel sounds are normal. There is no distension.     Palpations: Abdomen is soft. There is no mass.     Tenderness: There is no abdominal tenderness. There is no guarding or rebound.     Hernia: There is no hernia in the left inguinal area or right inguinal area.  Musculoskeletal:        General: No swelling or tenderness. Normal range of motion.     Cervical back: Normal range of motion and neck supple.  Lymphadenopathy:     Cervical: No cervical adenopathy.     Lower Body: No right inguinal adenopathy. No left inguinal adenopathy.  Neurological:     General: No focal deficit present.     Mental Status: She is alert and oriented to person, place, and time.     Cranial Nerves: No cranial nerve deficit.  Skin:    General: Skin is warm and dry.     Findings: No erythema or rash.  Psychiatric:        Mood and Affect: Mood normal.        Behavior: Behavior normal.        Judgment: Judgment normal.     Female chaperone present for pelvic and breast  portions of the physical exam  Results: AUDIT Questionnaire (screen for alcoholism): 2 PHQ-9: 4  Assessment and Plan:  50 y.o. [id number] female here for routine annual gynecologic examination  Plan: Problem List Items Addressed This Visit   None Visit Diagnoses     Women's annual routine gynecological examination    -  Primary   Screening for depression       Screening for alcoholism           Screening: -- Blood pressure screen normal -- Colonoscopy -  due.Patient uninsured.  -- Mammogram -  due. Sent request to [facility] to see if she's qualified.  --  Weight screening: overweight: continue to monitor -- Depression screening negative (PHQ-9) -- Nutrition: normal -- cholesterol screening: per PCP -- osteoporosis screening: not due -- tobacco screening: not using -- alcohol screening: AUDIT questionnaire indicates low-risk usage. -- family history of breast cancer screening: done. not at high risk. -- no evidence of domestic violence or intimate  partner violence. -- STD screening: gonorrhea/chlamydia NAAT not collected per patient request. -- pap smear  not due  per [facility] guidelines   Prentice Docker, MD 12/22/2020 2:08 PM

## 2021-01-15 ENCOUNTER — Telehealth: Payer: Self-pay

## 2021-01-15 NOTE — Telephone Encounter (Signed)
Spoke to Courtney Cruz about her symptoms to advise her going to the ER or urgent care.

## 2021-04-29 ENCOUNTER — Ambulatory Visit: Payer: Self-pay | Admitting: Family Medicine

## 2021-08-19 ENCOUNTER — Ambulatory Visit (INDEPENDENT_AMBULATORY_CARE_PROVIDER_SITE_OTHER): Payer: 59 | Admitting: Family Medicine

## 2021-08-19 ENCOUNTER — Encounter: Payer: Self-pay | Admitting: Family Medicine

## 2021-08-19 VITALS — BP 122/80 | HR 60 | Ht 59.0 in | Wt 195.0 lb

## 2021-08-19 DIAGNOSIS — F32A Depression, unspecified: Secondary | ICD-10-CM

## 2021-08-19 DIAGNOSIS — D509 Iron deficiency anemia, unspecified: Secondary | ICD-10-CM | POA: Diagnosis not present

## 2021-08-19 DIAGNOSIS — Z1231 Encounter for screening mammogram for malignant neoplasm of breast: Secondary | ICD-10-CM

## 2021-08-19 DIAGNOSIS — Z1211 Encounter for screening for malignant neoplasm of colon: Secondary | ICD-10-CM

## 2021-08-19 DIAGNOSIS — F419 Anxiety disorder, unspecified: Secondary | ICD-10-CM

## 2021-08-19 DIAGNOSIS — Z90710 Acquired absence of both cervix and uterus: Secondary | ICD-10-CM

## 2021-08-19 DIAGNOSIS — R69 Illness, unspecified: Secondary | ICD-10-CM | POA: Diagnosis not present

## 2021-08-19 MED ORDER — SERTRALINE HCL 25 MG PO TABS: 25.0000 mg | ORAL_TABLET | Freq: Every day | ORAL | 3 refills | Status: DC

## 2021-08-19 NOTE — Addendum Note (Signed)
Addended by: Juline Patch on: 08/19/2021 03:11 PM   Modules accepted: Orders

## 2021-08-19 NOTE — Progress Notes (Addendum)
Date:  08/19/2021   Name:  Courtney Cruz   DOB:  December 17, 1970   MRN:  [id number]   Chief Complaint: [place] and breast exam  Patient is a 51 year old female who presents for a establish care exam. The patient reports the following problems: hx anemia. Health maintenance has been reviewed mammogram/colon screen.    Depression        This is a new problem.  The current episode started more than 1 month ago.   Associated symptoms include fatigue, restlessness, decreased interest, appetite change and sad.  Associated symptoms include no myalgias, no headaches and no suicidal ideas.  Past treatments include nothing.   Lab Results  Component Value Date   NA 141 10/18/2019   K 3.3 (L) 10/18/2019   CO2 23 10/18/2019   GLUCOSE 135 (H) 10/18/2019   BUN 6 10/18/2019   CREATININE 0.91 10/18/2019   CALCIUM 8.2 (L) 10/18/2019   GFRNONAA >60 10/18/2019   No results found for: "CHOL", "HDL", "LDLCALC", "LDLDIRECT", "TRIG", "CHOLHDL" No results found for: "TSH" No results found for: "HGBA1C" Lab Results  Component Value Date   WBC 7.3 10/18/2019   HGB 9.8 (L) 10/18/2019   HCT 30.7 (L) 10/18/2019   MCV 77.1 (L) 10/18/2019   PLT 289 10/18/2019   Lab Results  Component Value Date   ALT 14 10/14/2019   AST 18 10/14/2019   ALKPHOS 64 10/14/2019   BILITOT 0.5 10/14/2019   No results found for: "25OHVITD2", "25OHVITD3", "VD25OH"   Review of Systems  Constitutional:  Positive for appetite change, diaphoresis and fatigue. Negative for chills and fever.  HENT:  Negative for drooling, ear discharge, ear pain and sore throat.   Respiratory:  Negative for cough, shortness of breath and wheezing.   Cardiovascular:  Negative for chest pain, palpitations and leg swelling.  Gastrointestinal:  Negative for abdominal pain, blood in stool, constipation, diarrhea and nausea.  Endocrine: Negative for polydipsia.  Genitourinary:  Negative for dysuria, frequency, hematuria and urgency.   Musculoskeletal:  Negative for back pain, myalgias and neck pain.  Skin:  Negative for rash.  Allergic/Immunologic: Negative for environmental allergies.  Neurological:  Negative for dizziness and headaches.  Hematological:  Does not bruise/bleed easily.  Psychiatric/Behavioral:  Positive for depression. Negative for suicidal ideas. The patient is not nervous/anxious.     Patient Active Problem List   Diagnosis Date Noted   Cervical mass 10/16/2019   Status post abdominal hysterectomy 10/16/2019   Menorrhagia 09/25/2019   Uterine mass 09/25/2019    Allergies  Allergen Reactions   Sumatriptan Anaphylaxis and Shortness Of Breath   Latex Rash    Past Surgical History:  Procedure Laterality Date   CESAREAN SECTION     HYSTERECTOMY ABDOMINAL WITH SALPINGECTOMY Bilateral 10/16/2019   Procedure: HYSTERECTOMY ABDOMINAL WITH SALPINGECTOMY;  Surgeon: Will Bonnet, MD;  Location: [facility] ORS;  Service: Gynecology;  Laterality: Bilateral;   LAPAROTOMY WITH STAGING N/A 10/16/2019   Procedure: LAPAROTOMY EXPLORATORY;  Surgeon: Will Bonnet, MD;  Location: [facility] ORS;  Service: Gynecology;  Laterality: N/A;    Social History   Tobacco Use   Smoking status: Former   Smokeless tobacco: Never  Scientific laboratory technician Use: Never used  Substance Use Topics   Alcohol use: Yes    Comment: Occ   Drug use: Never     Medication list has been reviewed and updated.  No outpatient medications have been marked as taking for the 08/19/21 encounter (  Office Visit) with Juline Patch, MD.       08/19/2021    2:13 PM  GAD 7 : Generalized Anxiety Score  Nervous, Anxious, on Edge 1  Control/stop worrying 1  Worry too much - different things 1  Trouble relaxing 1  Restless 0  Easily annoyed or irritable 1  Afraid - awful might happen 0  Total GAD 7 Score 5  Anxiety Difficulty Not difficult at all       08/19/2021    2:13 PM  Depression screen PHQ 2/9  Decreased Interest 1  Down,  Depressed, Hopeless 2  PHQ - 2 Score 3  Altered sleeping 1  Tired, decreased energy 2  Change in appetite 2  Feeling bad or failure about yourself  1  Trouble concentrating 0  Moving slowly or fidgety/restless 0  Suicidal thoughts 0  PHQ-9 Score 9  Difficult doing work/chores Somewhat difficult    BP Readings from Last 3 Encounters:  08/19/21 122/80  12/22/20 126/84  11/29/19 120/80    Physical Exam Vitals and nursing note reviewed. Exam conducted with a chaperone present.  Constitutional:      General: She is not in acute distress.    Appearance: She is not diaphoretic.  HENT:     Head: Normocephalic and atraumatic.     Jaw: There is normal jaw occlusion.     Right Ear: Tympanic membrane and external ear normal.     Left Ear: Tympanic membrane and external ear normal.     Nose: Nose normal. No congestion or rhinorrhea.     Mouth/Throat:     Mouth: Mucous membranes are moist.     Pharynx: Oropharynx is clear. Uvula midline.  Eyes:     General:        Right eye: No discharge.        Left eye: No discharge.     Conjunctiva/sclera: Conjunctivae normal.     Pupils: Pupils are equal, round, and reactive to light.  Neck:     Thyroid: No thyromegaly.     Vascular: No JVD.  Cardiovascular:     Rate and Rhythm: Normal rate and regular rhythm.     Heart sounds: Normal heart sounds, S1 normal and S2 normal. No murmur heard.    No systolic murmur is present.     No diastolic murmur is present.     No friction rub. No gallop. No S3 or S4 sounds.  Pulmonary:     Effort: Pulmonary effort is normal.     Breath sounds: Normal breath sounds. No wheezing or rhonchi.  Chest:  Breasts:    Right: Normal. No swelling, bleeding, inverted nipple, mass, nipple discharge, skin change or tenderness.     Left: Normal. No swelling, bleeding, inverted nipple, mass, nipple discharge, skin change or tenderness.  Abdominal:     General: Bowel sounds are normal.     Palpations: Abdomen is soft.  There is no hepatomegaly, splenomegaly or mass.     Tenderness: There is no abdominal tenderness. There is no guarding.  Musculoskeletal:        General: Normal range of motion.     Cervical back: Normal range of motion and neck supple.  Lymphadenopathy:     Cervical: No cervical adenopathy.  Skin:    General: Skin is warm and dry.  Neurological:     Mental Status: She is alert.     Deep Tendon Reflexes: Reflexes are normal and symmetric.     Wt Readings  from Last 3 Encounters:  08/19/21 195 lb (88.5 kg)  12/22/20 205 lb (93 kg)  11/29/19 193 lb (87.5 kg)    BP 122/80   Pulse 60   Ht '4\' 11"'$  (1.499 m)   Wt 195 lb (88.5 kg)   LMP 09/28/2019   BMI 39.39 kg/m   Assessment and Plan: Patient establishing care with new physician.  Patient's chart was reviewed for previous encounters most recent labs most recent imaging in [place]. 1. Iron deficiency anemia, unspecified iron deficiency anemia type Chronic.  Has always had iron deficiency anemia and has been treated with iron.  Currently has taken over-the-counter iron supplementation.  2. Breast cancer screening by mammogram Discussed with patient and mammogram was ordered.  Breast exam was unremarkable for masses or abnormalities. - MM 3D SCREEN BREAST BILATERAL  3. Colon cancer screening Discussed with patient and referral made to gastroenterology for colon cancer screening. - Ambulatory referral to Gastroenterology   Patient has had hysterectomy with removal of cervix

## 2021-08-19 NOTE — Progress Notes (Deleted)
Date:  08/19/2021   Name:  Courtney Cruz   DOB:  01-12-1971   MRN:  [id number]   Chief Complaint: Establish Care, breast exam, and Depression  Depression        This is a chronic problem.  The current episode started more than 1 year ago.   The problem occurs intermittently.  The problem has been gradually worsening since onset.  Associated symptoms include insomnia, irritable, restlessness, decreased interest and sad.  Associated symptoms include no decreased concentration, no fatigue, no helplessness, no hopelessness and no suicidal ideas.  Past treatments include nothing.  Compliance with treatment is variable.   Lab Results  Component Value Date   NA 141 10/18/2019   K 3.3 (L) 10/18/2019   CO2 23 10/18/2019   GLUCOSE 135 (H) 10/18/2019   BUN 6 10/18/2019   CREATININE 0.91 10/18/2019   CALCIUM 8.2 (L) 10/18/2019   GFRNONAA >60 10/18/2019   No results found for: "CHOL", "HDL", "LDLCALC", "LDLDIRECT", "TRIG", "CHOLHDL" No results found for: "TSH" No results found for: "HGBA1C" Lab Results  Component Value Date   WBC 7.3 10/18/2019   HGB 9.8 (L) 10/18/2019   HCT 30.7 (L) 10/18/2019   MCV 77.1 (L) 10/18/2019   PLT 289 10/18/2019   Lab Results  Component Value Date   ALT 14 10/14/2019   AST 18 10/14/2019   ALKPHOS 64 10/14/2019   BILITOT 0.5 10/14/2019   No results found for: "25OHVITD2", "25OHVITD3", "VD25OH"   Review of Systems  Constitutional:  Negative for fatigue.  Psychiatric/Behavioral:  Positive for depression. Negative for decreased concentration and suicidal ideas. The patient has insomnia.     Patient Active Problem List   Diagnosis Date Noted   Cervical mass 10/16/2019   Status post abdominal hysterectomy 10/16/2019   Menorrhagia 09/25/2019   Uterine mass 09/25/2019    Allergies  Allergen Reactions   Sumatriptan Anaphylaxis and Shortness Of Breath   Latex Rash    Past Surgical History:  Procedure Laterality Date   CESAREAN SECTION      HYSTERECTOMY ABDOMINAL WITH SALPINGECTOMY Bilateral 10/16/2019   Procedure: HYSTERECTOMY ABDOMINAL WITH SALPINGECTOMY;  Surgeon: Will Bonnet, MD;  Location: [facility] ORS;  Service: Gynecology;  Laterality: Bilateral;   LAPAROTOMY WITH STAGING N/A 10/16/2019   Procedure: LAPAROTOMY EXPLORATORY;  Surgeon: Will Bonnet, MD;  Location: [facility] ORS;  Service: Gynecology;  Laterality: N/A;    Social History   Tobacco Use   Smoking status: Former   Smokeless tobacco: Never  Scientific laboratory technician Use: Never used  Substance Use Topics   Alcohol use: Yes    Comment: Occ   Drug use: Never     Medication list has been reviewed and updated.  No outpatient medications have been marked as taking for the 08/19/21 encounter (Office Visit) with Juline Patch, MD.       08/19/2021    2:13 PM  GAD 7 : Generalized Anxiety Score  Nervous, Anxious, on Edge 1  Control/stop worrying 1  Worry too much - different things 1  Trouble relaxing 1  Restless 0  Easily annoyed or irritable 1  Afraid - awful might happen 0  Total GAD 7 Score 5  Anxiety Difficulty Not difficult at all       08/19/2021    2:13 PM  Depression screen PHQ 2/9  Decreased Interest 1  Down, Depressed, Hopeless 2  PHQ - 2 Score 3  Altered sleeping 1  Tired, decreased energy 2  Change in appetite 2  Feeling bad or failure about yourself  1  Trouble concentrating 0  Moving slowly or fidgety/restless 0  Suicidal thoughts 0  PHQ-9 Score 9  Difficult doing work/chores Somewhat difficult    BP Readings from Last 3 Encounters:  08/19/21 122/80  12/22/20 126/84  11/29/19 120/80    Physical Exam Constitutional:      General: She is irritable.  HENT:     Right Ear: Tympanic membrane normal.     Left Ear: Tympanic membrane normal.     Nose: Nose normal. No congestion or rhinorrhea.  Pulmonary:     Breath sounds: No wheezing, rhonchi or rales.     Wt Readings from Last 3 Encounters:  08/19/21 195 lb (88.5 kg)   12/22/20 205 lb (93 kg)  11/29/19 193 lb (87.5 kg)    BP 122/80   Pulse 60   Ht '4\' 11"'$  (1.499 m)   Wt 195 lb (88.5 kg)   LMP 09/28/2019   BMI 39.39 kg/m   Assessment and Plan:

## 2021-08-20 ENCOUNTER — Telehealth: Payer: Self-pay

## 2021-08-20 ENCOUNTER — Other Ambulatory Visit: Payer: Self-pay

## 2021-08-20 DIAGNOSIS — Z1211 Encounter for screening for malignant neoplasm of colon: Secondary | ICD-10-CM

## 2021-08-20 MED ORDER — NA SULFATE-K SULFATE-MG SULF 17.5-3.13-1.6 GM/177ML PO SOLN: 1.0000 | Freq: Once | ORAL | 0 refills | Status: AC

## 2021-08-26 ENCOUNTER — Encounter: Payer: Self-pay | Admitting: Gastroenterology

## 2021-09-09 ENCOUNTER — Other Ambulatory Visit: Payer: Self-pay

## 2021-09-09 ENCOUNTER — Encounter
Admission: RE | Disposition: A | Discharge status: Home / Self Care | Payer: Self-pay | Source: Ambulatory Visit | Attending: Gastroenterology

## 2021-09-09 ENCOUNTER — Ambulatory Visit: Payer: 59 | Admitting: Anesthesiology

## 2021-09-09 ENCOUNTER — Encounter: Payer: Self-pay | Admitting: Gastroenterology

## 2021-09-09 ENCOUNTER — Ambulatory Visit
Admission: RE | Admit: 2021-09-09 | Discharge: 2021-09-09 | Disposition: A | Discharge status: Home / Self Care | Payer: 59 | Source: Ambulatory Visit | Attending: Gastroenterology | Admitting: Gastroenterology

## 2021-09-09 DIAGNOSIS — E669 Obesity, unspecified: Secondary | ICD-10-CM | POA: Diagnosis not present

## 2021-09-09 DIAGNOSIS — Z6837 Body mass index (BMI) 37.0-37.9, adult: Secondary | ICD-10-CM | POA: Diagnosis not present

## 2021-09-09 DIAGNOSIS — F17200 Nicotine dependence, unspecified, uncomplicated: Secondary | ICD-10-CM | POA: Diagnosis not present

## 2021-09-09 DIAGNOSIS — Z1211 Encounter for screening for malignant neoplasm of colon: Secondary | ICD-10-CM | POA: Diagnosis not present

## 2021-09-09 DIAGNOSIS — K635 Polyp of colon: Secondary | ICD-10-CM | POA: Diagnosis not present

## 2021-09-09 DIAGNOSIS — K640 First degree hemorrhoids: Secondary | ICD-10-CM | POA: Diagnosis not present

## 2021-09-09 DIAGNOSIS — D123 Benign neoplasm of transverse colon: Secondary | ICD-10-CM | POA: Diagnosis not present

## 2021-09-09 DIAGNOSIS — D126 Benign neoplasm of colon, unspecified: Secondary | ICD-10-CM | POA: Diagnosis not present

## 2021-09-09 DIAGNOSIS — R519 Headache, unspecified: Secondary | ICD-10-CM | POA: Diagnosis not present

## 2021-09-09 DIAGNOSIS — N888 Other specified noninflammatory disorders of cervix uteri: Secondary | ICD-10-CM

## 2021-09-09 DIAGNOSIS — Z87891 Personal history of nicotine dependence: Secondary | ICD-10-CM | POA: Insufficient documentation

## 2021-09-09 DIAGNOSIS — K219 Gastro-esophageal reflux disease without esophagitis: Secondary | ICD-10-CM | POA: Diagnosis not present

## 2021-09-09 DIAGNOSIS — D122 Benign neoplasm of ascending colon: Secondary | ICD-10-CM | POA: Insufficient documentation

## 2021-09-09 HISTORY — PX: COLONOSCOPY WITH PROPOFOL: SHX5780

## 2021-09-09 SURGERY — COLONOSCOPY WITH PROPOFOL
Anesthesia: General | Site: Rectum

## 2021-09-09 MED ORDER — SODIUM CHLORIDE 0.9 % IV SOLN: INTRAVENOUS | Status: DC

## 2021-09-09 MED ORDER — STERILE WATER FOR IRRIGATION IR SOLN
Status: DC | PRN
  Administered 2021-09-09: 1

## 2021-09-09 MED ORDER — LACTATED RINGERS IV SOLN: INTRAVENOUS | Status: DC

## 2021-09-09 MED ORDER — LIDOCAINE HCL (CARDIAC) PF 100 MG/5ML IV SOSY
PREFILLED_SYRINGE | INTRAVENOUS | Status: DC | PRN
  Administered 2021-09-09: 40 mg via INTRAVENOUS

## 2021-09-09 MED ORDER — PROPOFOL 10 MG/ML IV BOLUS
INTRAVENOUS | Status: DC | PRN
  Administered 2021-09-09: 30 mg via INTRAVENOUS
  Administered 2021-09-09: 50 mg via INTRAVENOUS
  Administered 2021-09-09: 70 mg via INTRAVENOUS
  Administered 2021-09-09: 50 mg via INTRAVENOUS

## 2021-09-09 SURGICAL SUPPLY — 18 items
CLIP HMST 235XBRD CATH ROT (MISCELLANEOUS) IMPLANT
CLIP RESOLUTION 360 11X235 (MISCELLANEOUS)
ELECT REM PT RETURN 9FT ADLT (ELECTROSURGICAL)
ELECTRODE REM PT RTRN 9FT ADLT (ELECTROSURGICAL) IMPLANT
FORCEPS BIOP RAD 4 LRG CAP 4 (CUTTING FORCEPS) IMPLANT
GOWN CVR UNV OPN BCK APRN NK (MISCELLANEOUS) ×2 IMPLANT
GOWN ISOL THUMB LOOP REG UNIV (MISCELLANEOUS) ×4
INJECTOR VARIJECT VIN23 (MISCELLANEOUS) IMPLANT
KIT PRC NS LF DISP ENDO (KITS) ×1 IMPLANT
KIT PROCEDURE OLYMPUS (KITS) ×2
MANIFOLD NEPTUNE II (INSTRUMENTS) ×2 IMPLANT
MARKER SPOT ENDO TATTOO 5ML (MISCELLANEOUS) IMPLANT
SNARE COLD EXACTO (MISCELLANEOUS) ×1 IMPLANT
SNARE SHORT THROW 13M SML OVAL (MISCELLANEOUS) IMPLANT
SPOT EX ENDOSCOPIC TATTOO (MISCELLANEOUS)
TRAP ETRAP POLY (MISCELLANEOUS) ×1 IMPLANT
VARIJECT INJECTOR VIN23 (MISCELLANEOUS)
WATER STERILE IRR 250ML POUR (IV SOLUTION) ×2 IMPLANT

## 2021-09-09 NOTE — Anesthesia Postprocedure Evaluation (Signed)
Anesthesia Post Note  Patient: Courtney Cruz  Procedure(s) Performed: COLONOSCOPY WITH PROPOFOL (Rectum)     Patient location during evaluation: PACU Anesthesia Type: General Level of consciousness: awake and alert Pain management: pain level controlled Vital Signs Assessment: post-procedure vital signs reviewed and stable Respiratory status: nonlabored ventilation and spontaneous breathing Cardiovascular status: blood pressure returned to baseline Postop Assessment: no apparent nausea or vomiting Anesthetic complications: no   No notable events documented.  Illana Nolting [employer]

## 2021-09-09 NOTE — Op Note (Signed)
[facility] Gastroenterology Patient Name: Courtney Cruz Procedure Date: 09/09/2021 9:21 AM MRN: [id number] Account #: [id number] Date of Birth: [birth year] Admit Type: Outpatient Age: 51 Room: [facility] OR ROOM 01 Gender: Female Note Status: Finalized Instrument Name: [id number] Procedure:             Colonoscopy Indications:           Screening for colorectal malignant neoplasm Providers:             Lucilla Lame MD, MD Referring MD:          Juline Patch, MD (Referring MD) Medicines:             Propofol per Anesthesia Complications:         No immediate complications. Procedure:             Pre-Anesthesia Assessment:                        - Prior to the procedure, a History and Physical was                         performed, and patient medications and allergies were                         reviewed. The patient's tolerance of previous                         anesthesia was also reviewed. The risks and benefits                         of the procedure and the sedation options and risks                         were discussed with the patient. All questions were                         answered, and informed consent was obtained. Prior                         Anticoagulants: The patient has taken no previous                         anticoagulant or antiplatelet agents. ASA Grade                         Assessment: II - A patient with mild systemic disease.                         After reviewing the risks and benefits, the patient                         was deemed in satisfactory condition to undergo the                         procedure.                        After obtaining informed consent, the colonoscope was  passed under direct vision. Throughout the procedure,                         the patient's blood pressure, pulse, and oxygen                         saturations were monitored continuously. The was                          introduced through the anus and advanced to the the                         cecum, identified by appendiceal orifice and ileocecal                         valve. The colonoscopy was performed without                         difficulty. The patient tolerated the procedure well.                         The quality of the bowel preparation was excellent. Findings:      The perianal and digital rectal examinations were normal.      A 6 mm polyp was found in the transverse colon. The polyp was sessile.       The polyp was removed with a cold snare. Resection and retrieval were       complete.      A 3 mm polyp was found in the ascending colon. The polyp was sessile.       The polyp was removed with a cold snare. Resection and retrieval were       complete.      Non-bleeding internal hemorrhoids were found during retroflexion. The       hemorrhoids were Grade I (internal hemorrhoids that do not prolapse). Impression:            - One 6 mm polyp in the transverse colon, removed with                         a cold snare. Resected and retrieved.                        - One 3 mm polyp in the ascending colon, removed with                         a cold snare. Resected and retrieved.                        - Non-bleeding internal hemorrhoids. Recommendation:        - Discharge patient to home.                        - Resume previous diet.                        - Continue present medications.                        - Await pathology results.                        -  If the pathology report reveals adenomatous tissue,                         then repeat the colonoscopy for surveillance in 7                         years otherwise 10 years. Procedure Code(s):     --- Professional ---                        [phone], Colonoscopy, flexible; with removal of                         tumor(s), polyp(s), or other lesion(s) by snare                         technique Diagnosis Code(s):     --- Professional  ---                        Z12.11, Encounter for screening for malignant neoplasm                         of colon                        K63.5, Polyp of colon CPT copyright 2019 American Medical Association. All rights reserved. The codes documented in this report are preliminary and upon coder review may  be revised to meet current compliance requirements. Lucilla Lame MD, MD 09/09/2021 9:44:18 AM This report has been signed electronically. Number of Addenda: 0 Note Initiated On: 09/09/2021 9:21 AM Scope Withdrawal Time: 0 hours 8 minutes 15 seconds  Total Procedure Duration: 0 hours 12 minutes 58 seconds  Estimated Blood Loss:  Estimated blood loss: none.      [facility]

## 2021-09-09 NOTE — Transfer of Care (Signed)
Immediate Anesthesia Transfer of Care Note  Patient: Courtney Cruz  Procedure(s) Performed: COLONOSCOPY WITH PROPOFOL (Rectum)  Patient Location: PACU  Anesthesia Type: General  Level of Consciousness: awake, alert  and patient cooperative  Airway and Oxygen Therapy: Patient Spontanous Breathing and Patient connected to supplemental oxygen  Post-op Assessment: Post-op Vital signs reviewed, Patient's Cardiovascular Status Stable, Respiratory Function Stable, Patent Airway and No signs of Nausea or vomiting  Post-op Vital Signs: Reviewed and stable  Complications: No notable events documented.

## 2021-09-09 NOTE — Anesthesia Preprocedure Evaluation (Signed)
Anesthesia Evaluation  Patient identified by MRN, date of birth, ID band Patient awake    Reviewed: Allergy & Precautions, H&P , NPO status , Patient's Chart, lab work & pertinent test results  Airway Mallampati: II  TM Distance: >3 FB Neck ROM: full    Dental no notable dental hx.    Pulmonary former smoker,    Pulmonary exam normal        Cardiovascular negative cardio ROS Normal cardiovascular exam     Neuro/Psych  Headaches, negative psych ROS   GI/Hepatic Neg liver ROS, GERD  Controlled,  Endo/Other  BMI 37  Renal/GU negative Renal ROS  negative genitourinary   Musculoskeletal negative musculoskeletal ROS (+)   Abdominal (+) + obese,   Peds  Hematology negative hematology ROS (+)   Anesthesia Other Findings   Reproductive/Obstetrics negative OB ROS                             Anesthesia Physical  Anesthesia Plan  ASA: 2  Anesthesia Plan: General   Post-op Pain Management: Minimal or no pain anticipated   Induction:   PONV Risk Score and Plan: 3 and Propofol infusion, TIVA and Treatment may vary due to age or medical condition  Airway Management Planned: Natural Airway and Nasal Cannula  Additional Equipment:   Intra-op Plan:   Post-operative Plan:   Informed Consent: I have reviewed the patients History and Physical, chart, labs and discussed the procedure including the risks, benefits and alternatives for the proposed anesthesia with the patient or authorized representative who has indicated his/her understanding and acceptance.     Dental Advisory Given  Plan Discussed with: CRNA  Anesthesia Plan Comments:         Anesthesia Quick Evaluation

## 2021-09-09 NOTE — H&P (Signed)
Courtney Lame, MD [address] [place], [place] [address] Phone: [phone] Fax : [phone]  Primary Care Physician:  Juline Patch, MD Primary Gastroenterologist:  Dr. Allen Norris  Pre-Procedure History & Physical: HPI:  Courtney Cruz is a 51 y.o. female is here for a screening colonoscopy.   Past Medical History:  Diagnosis Date   Anemia    GERD (gastroesophageal reflux disease)    Headache    Uterine fibroid     Past Surgical History:  Procedure Laterality Date   CESAREAN SECTION     HYSTERECTOMY ABDOMINAL WITH SALPINGECTOMY Bilateral 10/16/2019   Procedure: HYSTERECTOMY ABDOMINAL WITH SALPINGECTOMY;  Surgeon: Will Bonnet, MD;  Location: [facility] ORS;  Service: Gynecology;  Laterality: Bilateral;   LAPAROTOMY WITH STAGING N/A 10/16/2019   Procedure: LAPAROTOMY EXPLORATORY;  Surgeon: Will Bonnet, MD;  Location: [facility] ORS;  Service: Gynecology;  Laterality: N/A;    Prior to Admission medications   Medication Sig Start Date End Date Taking? Authorizing Provider  sertraline (ZOLOFT) 25 MG tablet Take 1 tablet (25 mg total) by mouth daily. 08/19/21  Yes Juline Patch, MD  acetaminophen (TYLENOL) 500 MG tablet Take 2 tablets (1,000 mg total) by mouth every 6 (six) hours as needed for mild pain. Patient not taking: Reported on 11/29/2019 10/19/19   Homero Fellers, MD  ferrous sulfate 325 (65 FE) MG EC tablet Take 325 mg by mouth every morning.  Patient not taking: Reported on 08/19/2021 08/19/19   [provider]  ibuprofen (ADVIL) 600 MG tablet Take 1 tablet (600 mg total) by mouth every 8 (eight) hours as needed for mild pain. Patient not taking: Reported on 11/29/2019 09/10/19   Duffy Bruce, MD    Allergies as of 08/20/2021 - Review Complete 08/20/2021  Allergen Reaction Noted   Sumatriptan Anaphylaxis and Shortness Of Breath 05/23/2011   Latex Rash 11/06/2015    Family History  Problem Relation Age of Onset   Diabetes Sister     Stroke Maternal Grandfather    Diabetes Maternal Grandfather    Hypertension Maternal Grandfather    Cancer Paternal Grandmother     Social History   Socioeconomic History   Marital status: Married    Spouse name: Not on file   Number of children: Not on file   Years of education: Not on file   Highest education level: Not on file  Occupational History   Not on file  Tobacco Use   Smoking status: Former   Smokeless tobacco: Never  Vaping Use   Vaping Use: Never used  Substance and Sexual Activity   Alcohol use: Yes    Comment: Occ   Drug use: Never   Sexual activity: Yes    Birth control/protection: None  Other Topics Concern   Not on file  Social History Narrative   Not on file   Social Determinants of Health   Financial Resource Strain: Not on file  Food Insecurity: Not on file  Transportation Needs: Not on file  Physical Activity: Not on file  Stress: Not on file  Social Connections: Not on file  Intimate Partner Violence: Not on file    Review of Systems: See HPI, otherwise negative ROS  Physical Exam: BP 138/88   Pulse 70   Temp 97.8 F (36.6 C) (Temporal)   Ht '4\' 11"'$  (1.499 m)   Wt 85.2 kg   LMP 09/28/2019   SpO2 100%   BMI 37.93 kg/m  General:   Alert,  pleasant and cooperative in NAD Head:  Normocephalic and atraumatic. Neck:  Supple; no masses or thyromegaly. Lungs:  Clear throughout to auscultation.    Heart:  Regular rate and rhythm. Abdomen:  Soft, nontender and nondistended. Normal bowel sounds, without guarding, and without rebound.   Neurologic:  Alert and  oriented x4;  grossly normal neurologically.  Impression/Plan: Courtney Cruz is now here to undergo a screening colonoscopy.  Risks, benefits, and alternatives regarding colonoscopy have been reviewed with the patient.  Questions have been answered.  All parties agreeable.

## 2021-09-10 ENCOUNTER — Other Ambulatory Visit: Payer: Self-pay | Admitting: Family Medicine

## 2021-09-10 ENCOUNTER — Encounter: Payer: Self-pay | Admitting: Gastroenterology

## 2021-09-10 DIAGNOSIS — F419 Anxiety disorder, unspecified: Secondary | ICD-10-CM

## 2021-09-10 NOTE — Telephone Encounter (Signed)
Requested Prescriptions  Pending Prescriptions Disp Refills  . sertraline (ZOLOFT) 25 MG tablet [Pharmacy Med Name: SERTRALINE HCL 25 MG TABLET] 90 tablet 1    Sig: TAKE 1 TABLET (25 MG TOTAL) BY MOUTH DAILY.     Psychiatry:  Antidepressants - SSRI - sertraline Failed - 09/10/2021  1:31 PM      Failed - AST in normal range and within 360 days    AST  Date Value Ref Range Status  10/14/2019 18 15 - 41 U/L Final   SGOT(AST)  Date Value Ref Range Status  12/14/2012 23 15 - 37 Unit/L Final         Failed - ALT in normal range and within 360 days    ALT  Date Value Ref Range Status  10/14/2019 14 0 - 44 U/L Final   SGPT (ALT)  Date Value Ref Range Status  12/14/2012 16 12 - 78 U/L Final         Passed - Completed PHQ-2 or PHQ-9 in the last 360 days      Passed - Valid encounter within last 6 months    Recent Outpatient Visits          3 weeks ago Iron deficiency anemia, unspecified iron deficiency anemia type   [place], MD      Future Appointments            In 2 weeks Juline Patch, MD [facility], [facility]

## 2021-09-13 LAB — SURGICAL PATHOLOGY

## 2021-09-14 ENCOUNTER — Ambulatory Visit
Admission: RE | Admit: 2021-09-14 | Discharge: 2021-09-14 | Disposition: A | Discharge status: Home / Self Care | Payer: 59 | Source: Ambulatory Visit | Attending: Family Medicine | Admitting: Family Medicine

## 2021-09-14 DIAGNOSIS — Z1231 Encounter for screening mammogram for malignant neoplasm of breast: Secondary | ICD-10-CM | POA: Diagnosis not present

## 2021-09-16 LAB — HM COLONOSCOPY

## 2021-09-26 ENCOUNTER — Encounter: Payer: Self-pay | Admitting: Gastroenterology

## 2021-09-30 ENCOUNTER — Ambulatory Visit: Payer: 59 | Admitting: Family Medicine

## 2021-09-30 ENCOUNTER — Encounter: Payer: Self-pay | Admitting: Family Medicine

## 2021-10-07 ENCOUNTER — Encounter: Payer: Self-pay | Admitting: Family Medicine

## 2021-10-07 ENCOUNTER — Ambulatory Visit (INDEPENDENT_AMBULATORY_CARE_PROVIDER_SITE_OTHER): Payer: 59 | Admitting: Family Medicine

## 2021-10-07 VITALS — BP 120/80 | HR 80 | Ht 59.0 in | Wt 194.0 lb

## 2021-10-07 DIAGNOSIS — D649 Anemia, unspecified: Secondary | ICD-10-CM | POA: Diagnosis not present

## 2021-10-07 DIAGNOSIS — F32A Depression, unspecified: Secondary | ICD-10-CM | POA: Diagnosis not present

## 2021-10-07 DIAGNOSIS — R739 Hyperglycemia, unspecified: Secondary | ICD-10-CM

## 2021-10-07 DIAGNOSIS — F419 Anxiety disorder, unspecified: Secondary | ICD-10-CM | POA: Diagnosis not present

## 2021-10-07 DIAGNOSIS — R69 Illness, unspecified: Secondary | ICD-10-CM | POA: Diagnosis not present

## 2021-10-07 MED ORDER — SERTRALINE HCL 25 MG PO TABS: 25.0000 mg | ORAL_TABLET | Freq: Every day | ORAL | 1 refills | Status: DC

## 2021-10-07 NOTE — Progress Notes (Signed)
Date:  10/07/2021   Name:  Courtney Cruz   DOB:  1970/05/29   MRN:  [id number]   Chief Complaint: Anemia, Hyperglycemia, and Depression  Anemia Presents for follow-up visit. Symptoms include light-headedness. There has been no abdominal pain, anorexia, bruising/bleeding easily, fever, malaise/fatigue, palpitations or weight loss. Signs of blood loss that are not present include hematochezia, melena and vaginal bleeding. There are no compliance problems.   Hyperglycemia This is a chronic problem. The current episode started more than 1 year ago. The problem occurs intermittently. The problem has been gradually improving. Pertinent negatives include no abdominal pain, anorexia, arthralgias, change in bowel habit, chest pain, chills, coughing, fatigue, fever, headaches, myalgias, nausea, neck pain, rash or sore throat.  Depression        Associated symptoms include no fatigue, no myalgias, no headaches and no suicidal ideas.   Lab Results  Component Value Date   NA 141 10/18/2019   K 3.3 (L) 10/18/2019   CO2 23 10/18/2019   GLUCOSE 135 (H) 10/18/2019   BUN 6 10/18/2019   CREATININE 0.91 10/18/2019   CALCIUM 8.2 (L) 10/18/2019   GFRNONAA >60 10/18/2019   No results found for: "CHOL", "HDL", "LDLCALC", "LDLDIRECT", "TRIG", "CHOLHDL" No results found for: "TSH" No results found for: "HGBA1C" Lab Results  Component Value Date   WBC 7.3 10/18/2019   HGB 9.8 (L) 10/18/2019   HCT 30.7 (L) 10/18/2019   MCV 77.1 (L) 10/18/2019   PLT 289 10/18/2019   Lab Results  Component Value Date   ALT 14 10/14/2019   AST 18 10/14/2019   ALKPHOS 64 10/14/2019   BILITOT 0.5 10/14/2019   No results found for: "25OHVITD2", "25OHVITD3", "VD25OH"   Review of Systems  Constitutional:  Negative for chills, fatigue, fever, malaise/fatigue and weight loss.  HENT:  Negative for drooling, ear discharge, ear pain and sore throat.   Respiratory:  Negative for cough, shortness of breath and wheezing.    Cardiovascular:  Negative for chest pain, palpitations and leg swelling.  Gastrointestinal:  Negative for abdominal pain, anorexia, blood in stool, change in bowel habit, constipation, diarrhea, hematochezia, melena and nausea.  Endocrine: Negative for polydipsia.  Genitourinary:  Negative for dysuria, frequency, hematuria, urgency and vaginal bleeding.  Musculoskeletal:  Negative for arthralgias, back pain, myalgias and neck pain.  Skin:  Negative for rash.  Allergic/Immunologic: Negative for environmental allergies.  Neurological:  Positive for light-headedness. Negative for dizziness and headaches.  Hematological:  Does not bruise/bleed easily.  Psychiatric/Behavioral:  Positive for depression. Negative for suicidal ideas. The patient is not nervous/anxious.     Patient Active Problem List   Diagnosis Date Noted   Encounter for screening colonoscopy    Polyp of transverse colon    Cervical mass 10/16/2019   Status post abdominal hysterectomy 10/16/2019   Menorrhagia 09/25/2019   Uterine mass 09/25/2019    Allergies  Allergen Reactions   Sumatriptan Anaphylaxis and Shortness Of Breath   Latex Rash    Past Surgical History:  Procedure Laterality Date   CESAREAN SECTION     COLONOSCOPY WITH PROPOFOL N/A 09/09/2021   Procedure: COLONOSCOPY WITH PROPOFOL;  Surgeon: Lucilla Lame, MD;  Location: [place];  Service: Endoscopy;  Laterality: N/A;   HYSTERECTOMY ABDOMINAL WITH SALPINGECTOMY Bilateral 10/16/2019   Procedure: HYSTERECTOMY ABDOMINAL WITH SALPINGECTOMY;  Surgeon: Will Bonnet, MD;  Location: [facility];  Service: Gynecology;  Laterality: Bilateral;   LAPAROTOMY WITH STAGING N/A 10/16/2019   Procedure: LAPAROTOMY EXPLORATORY;  Surgeon:  Will Bonnet, MD;  Location: [facility];  Service: Gynecology;  Laterality: N/A;    Social History   Tobacco Use   Smoking status: Former   Smokeless tobacco: Never  Scientific laboratory technician Use: Never used  Substance Use  Topics   Alcohol use: Yes    Comment: Occ   Drug use: Never     Medication list has been reviewed and updated.  Current Meds  Medication Sig   sertraline (ZOLOFT) 25 MG tablet TAKE 1 TABLET (25 MG TOTAL) BY MOUTH DAILY.       10/07/2021    2:35 PM 08/19/2021    2:13 PM  GAD 7 : Generalized Anxiety Score  Nervous, Anxious, on Edge 0 1  Control/stop worrying 0 1  Worry too much - different things 0 1  Trouble relaxing 0 1  Restless 0 0  Easily annoyed or irritable 0 1  Afraid - awful might happen 0 0  Total GAD 7 Score 0 5  Anxiety Difficulty  Not difficult at all       10/07/2021    2:34 PM 08/19/2021    2:13 PM  Depression screen PHQ 2/9  Decreased Interest 0 1  Down, Depressed, Hopeless 0 2  PHQ - 2 Score 0 3  Altered sleeping 0 1  Tired, decreased energy 0 2  Change in appetite 0 2  Feeling bad or failure about yourself  0 1  Trouble concentrating 0 0  Moving slowly or fidgety/restless 0 0  Suicidal thoughts 0 0  PHQ-9 Score 0 9  Difficult doing work/chores Not difficult at all Somewhat difficult    BP Readings from Last 3 Encounters:  10/07/21 120/80  09/09/21 117/73  08/19/21 122/80    Physical Exam Vitals and nursing note reviewed. Exam conducted with a chaperone present.  Constitutional:      General: She is not in acute distress.    Appearance: She is not diaphoretic.  HENT:     Head: Normocephalic and atraumatic.     Right Ear: External ear normal.     Left Ear: External ear normal.     Nose: Nose normal. No congestion or rhinorrhea.  Eyes:     General:        Right eye: No discharge.        Left eye: No discharge.     Conjunctiva/sclera: Conjunctivae normal.     Pupils: Pupils are equal, round, and reactive to light.  Neck:     Thyroid: No thyromegaly.     Vascular: No JVD.  Cardiovascular:     Rate and Rhythm: Normal rate and regular rhythm.     Heart sounds: Normal heart sounds. No murmur heard.    No friction rub. No gallop.   Pulmonary:     Effort: Pulmonary effort is normal.     Breath sounds: Normal breath sounds. No wheezing or rhonchi.  Abdominal:     General: Bowel sounds are normal.     Palpations: Abdomen is soft. There is no mass.     Tenderness: There is no abdominal tenderness. There is no guarding.  Musculoskeletal:        General: Normal range of motion.     Cervical back: Normal range of motion and neck supple.  Lymphadenopathy:     Cervical: No cervical adenopathy.  Skin:    General: Skin is warm and dry.  Neurological:     Mental Status: She is alert.     Deep  Tendon Reflexes: Reflexes are normal and symmetric.     Wt Readings from Last 3 Encounters:  10/07/21 194 lb (88 kg)  09/09/21 187 lb 12.8 oz (85.2 kg)  08/19/21 195 lb (88.5 kg)    BP 120/80   Pulse 80   Ht '4\' 11"'$  (1.499 m)   Wt 194 lb (88 kg)   LMP 09/28/2019   BMI 39.18 kg/m   Assessment and Plan:  1. Anxiety and depression Chronic.  Controlled.  Stable.  PHQ 0.  GAD score 0.  Continue sertraline 25 mg once a day.  Will recheck in 6 months - sertraline (ZOLOFT) 25 MG tablet; Take 1 tablet (25 mg total) by mouth daily.  Dispense: 90 tablet; Refill: 1  2. Anemia, unspecified type Chronic.  Controlled.  Stable.  Followed by GYN in the past we will check CBC and ferritin current status of anemia. - CBC w/Diff/Platelet - Ferritin  3. Hyperglycemia Chronic.  Controlled.  Stable.  Elevated serum glucose in the past we will check A1c and renal function panel for nonfasting glucose and electrolytes. - Lipid Panel With LDL/HDL Ratio - Renal Function Panel - Hemoglobin A1c    Otilio Miu, MD

## 2021-10-07 NOTE — Patient Instructions (Signed)

## 2021-10-08 ENCOUNTER — Telehealth: Payer: Self-pay

## 2021-10-08 ENCOUNTER — Other Ambulatory Visit: Payer: Self-pay

## 2021-10-08 DIAGNOSIS — E119 Type 2 diabetes mellitus without complications: Secondary | ICD-10-CM

## 2021-10-08 LAB — FERRITIN: Ferritin: 39 ng/mL (ref 15–150)

## 2021-10-08 LAB — CBC WITH DIFFERENTIAL/PLATELET
Basophils Absolute: 0.1 10*3/uL (ref 0.0–0.2)
Basos: 1 %
EOS (ABSOLUTE): 0.2 10*3/uL (ref 0.0–0.4)
Eos: 3 %
Hematocrit: 39.4 % (ref 34.0–46.6)
Hemoglobin: 12.8 g/dL (ref 11.1–15.9)
Immature Grans (Abs): 0 10*3/uL (ref 0.0–0.1)
Immature Granulocytes: 0 %
Lymphocytes Absolute: 2.3 10*3/uL (ref 0.7–3.1)
Lymphs: 28 %
MCH: 25.6 pg — ABNORMAL LOW (ref 26.6–33.0)
MCHC: 32.5 g/dL (ref 31.5–35.7)
MCV: 79 fL (ref 79–97)
Monocytes Absolute: 0.6 10*3/uL (ref 0.1–0.9)
Monocytes: 7 %
Neutrophils Absolute: 5.1 10*3/uL (ref 1.4–7.0)
Neutrophils: 61 %
Platelets: 295 10*3/uL (ref 150–450)
RBC: 5 x10E6/uL (ref 3.77–5.28)
RDW: 15.2 % (ref 11.7–15.4)
WBC: 8.3 10*3/uL (ref 3.4–10.8)

## 2021-10-08 LAB — RENAL FUNCTION PANEL
Albumin: 4.1 g/dL (ref 3.8–4.9)
BUN/Creatinine Ratio: 16 (ref 9–23)
BUN: 17 mg/dL (ref 6–24)
CO2: 23 mmol/L (ref 20–29)
Calcium: 9.6 mg/dL (ref 8.7–10.2)
Chloride: 104 mmol/L (ref 96–106)
Creatinine, Ser: 1.05 mg/dL — ABNORMAL HIGH (ref 0.57–1.00)
Glucose: 111 mg/dL — ABNORMAL HIGH (ref 70–99)
Phosphorus: 4.1 mg/dL (ref 3.0–4.3)
Potassium: 4.4 mmol/L (ref 3.5–5.2)
Sodium: 140 mmol/L (ref 134–144)
eGFR: 64 mL/min/{1.73_m2} (ref >59)

## 2021-10-08 LAB — LIPID PANEL WITH LDL/HDL RATIO
Cholesterol, Total: 181 mg/dL (ref 100–199)
HDL: 65 mg/dL (ref >39)
LDL Chol Calc (NIH): 97 mg/dL (ref 0–99)
LDL/HDL Ratio: 1.5 ratio (ref 0.0–3.2)
Triglycerides: 105 mg/dL (ref 0–149)
VLDL Cholesterol Cal: 19 mg/dL (ref 5–40)

## 2021-10-08 LAB — HEMOGLOBIN A1C
Est. average glucose Bld gHb Est-mCnc: 140 mg/dL
Hgb A1c MFr Bld: 6.5 % — ABNORMAL HIGH (ref 4.8–5.6)

## 2021-10-08 MED ORDER — METFORMIN HCL 500 MG PO TABS: 500.0000 mg | ORAL_TABLET | Freq: Every day | ORAL | 1 refills | Status: DC

## 2021-10-08 NOTE — Telephone Encounter (Signed)
Pt called back. Shared provider's note.  PT would like to start Metformin. PT will also work on diet.  Pt understands that labs will be ordered in 4 months.  Juline Patch, MD  10/08/2021  7:09 AM EDT     A1c is barely in diabetic range.  Patient can either maintain a diet of minimal concentrated sweets and limited carbohydrates.  We could also offer to put her on metformin 500 mg once a day.  Regardless we will recheck in 4 months fasting.

## 2021-10-19 ENCOUNTER — Telehealth: Payer: Self-pay

## 2021-10-19 ENCOUNTER — Encounter: Payer: Self-pay | Admitting: Family Medicine

## 2021-10-19 NOTE — Telephone Encounter (Signed)
Left message on voicemail requesting call back for advice---pt experiencing rectal bleeding with BM--not every time, denies constipation--loose stools-- lower abd pain and lower back pain intermittent x 1 year.. Pt denies fever  Pt states that she does not feel that this is an urgent issue as it is unchanged x 1 year since her hysterectomy....   Pt given ED precautions and advised that Dr Allen Norris is out of the office for the next few weeks....   Pt advised to reach out to PCP in the meantime and reach back out to be fit in when Dr Allen Norris returns if further evaluation is needed.Marland KitchenMarland Kitchen

## 2022-02-14 ENCOUNTER — Other Ambulatory Visit: Payer: Self-pay | Admitting: Family Medicine

## 2022-02-14 DIAGNOSIS — F419 Anxiety disorder, unspecified: Secondary | ICD-10-CM

## 2022-02-15 NOTE — Telephone Encounter (Signed)
Requested medication (s) are due for refill today:   No  Requested medication (s) are on the active medication list:   Yes  Future visit scheduled:   Yes 04/11/2022   Last ordered: 10/07/2021 #90, 1 refill  Returned because labs are due per protocol and pharmacy is requesting a 90 day supply and a DX Code.     Requested Prescriptions  Pending Prescriptions Disp Refills   sertraline (ZOLOFT) 25 MG tablet [Pharmacy Med Name: SERTRALINE HCL 25 MG TABLET] 90 tablet 2    Sig: Take 1 tablet (25 mg total) by mouth daily.     Psychiatry:  Antidepressants - SSRI - sertraline Failed - 02/14/2022 12:31 PM      Failed - AST in normal range and within 360 days    AST  Date Value Ref Range Status  10/14/2019 18 15 - 41 U/L Final   SGOT(AST)  Date Value Ref Range Status  12/14/2012 23 15 - 37 Unit/L Final         Failed - ALT in normal range and within 360 days    ALT  Date Value Ref Range Status  10/14/2019 14 0 - 44 U/L Final   SGPT (ALT)  Date Value Ref Range Status  12/14/2012 16 12 - 78 U/L Final         Passed - Completed PHQ-2 or PHQ-9 in the last 360 days      Passed - Valid encounter within last 6 months    Recent Outpatient Visits           4 months ago Anxiety and depression   [facility] [place], Deanna C, MD   6 months ago Iron deficiency anemia, unspecified iron deficiency anemia type   [facility] [place], [place], MD       Future Appointments             In 1 month Juline Patch, MD [facility] at [facility], [facility]

## 2022-03-01 ENCOUNTER — Ambulatory Visit
Admission: EM | Admit: 2022-03-01 | Discharge: 2022-03-01 | Disposition: A | Discharge status: Home / Self Care | Payer: 59 | Attending: Nurse Practitioner | Admitting: Nurse Practitioner

## 2022-03-01 DIAGNOSIS — J209 Acute bronchitis, unspecified: Secondary | ICD-10-CM | POA: Diagnosis not present

## 2022-03-01 MED ORDER — BENZONATATE 200 MG PO CAPS: 200.0000 mg | ORAL_CAPSULE | Freq: Three times a day (TID) | ORAL | 0 refills | Status: DC | PRN

## 2022-03-01 MED ORDER — AMOXICILLIN-POT CLAVULANATE 875-125 MG PO TABS: 1.0000 | ORAL_TABLET | Freq: Two times a day (BID) | ORAL | 0 refills | Status: DC

## 2022-03-01 NOTE — ED Provider Notes (Signed)
Courtney Cruz    CSN: [id number] Arrival date & time: 03/01/22  1232      History   Chief Complaint Chief Complaint  Patient presents with   Cough   Shortness of Breath    HPI Courtney Cruz is a 52 y.o. female  who presents for evaluation of URI symptoms for 2 weeks. Patient reports associated symptoms of productive cough with purulent sputum, congestion.  Reports she had fevers in the beginning of her illness but no fevers in the past 4 days.  Denies N/V/D, sore throat, headache, body aches, chills, shortness of breath. Patient does not have a hx of asthma or smoking.  Reports sick contacts via her job.  no recent travel. Pt is not vaccinated for COVID. Pt is not vaccinated for flu this season. Pt has taken nothing OTC for symptoms.  Reports DayQuil/NyQuil causes facial swelling.  Pt has no other concerns at this time.    Cough Shortness of Breath Associated symptoms: cough     Past Medical History:  Diagnosis Date   Anemia    GERD (gastroesophageal reflux disease)    Headache    Uterine fibroid     Patient Active Problem List   Diagnosis Date Noted   Encounter for screening colonoscopy    Polyp of transverse colon    Cervical mass 10/16/2019   Status post abdominal hysterectomy 10/16/2019   Menorrhagia 09/25/2019   Uterine mass 09/25/2019    Past Surgical History:  Procedure Laterality Date   CESAREAN SECTION     COLONOSCOPY WITH PROPOFOL N/A 09/09/2021   Procedure: COLONOSCOPY WITH PROPOFOL;  Surgeon: Lucilla Lame, MD;  Location: [place];  Service: Endoscopy;  Laterality: N/A;   HYSTERECTOMY ABDOMINAL WITH SALPINGECTOMY Bilateral 10/16/2019   Procedure: HYSTERECTOMY ABDOMINAL WITH SALPINGECTOMY;  Surgeon: Will Bonnet, MD;  Location: [facility];  Service: Gynecology;  Laterality: Bilateral;   LAPAROTOMY WITH STAGING N/A 10/16/2019   Procedure: LAPAROTOMY EXPLORATORY;  Surgeon: Will Bonnet, MD;  Location: [facility];  Service:  Gynecology;  Laterality: N/A;    OB History     Gravida  7   Para  4   Term      Preterm      AB  3   Living  4      SAB      IAB      Ectopic      Multiple      Live Births               Home Medications    Prior to Admission medications   Medication Sig Start Date End Date Taking? Authorizing Provider  amoxicillin-clavulanate (AUGMENTIN) 875-125 MG tablet Take 1 tablet by mouth every 12 (twelve) hours. 03/01/22  Yes Melynda Ripple, NP  benzonatate (TESSALON) 200 MG capsule Take 1 capsule (200 mg total) by mouth 3 (three) times daily as needed for cough. 03/01/22  Yes Melynda Ripple, NP  acetaminophen (TYLENOL) 500 MG tablet Take 2 tablets (1,000 mg total) by mouth every 6 (six) hours as needed for mild pain. Patient not taking: Reported on 10/07/2021 10/19/19   Homero Fellers, MD  ferrous sulfate 325 (65 FE) MG EC tablet Take 325 mg by mouth every morning.  Patient not taking: Reported on 08/19/2021 08/19/19   [provider]  ibuprofen (ADVIL) 600 MG tablet Take 1 tablet (600 mg total) by mouth every 8 (eight) hours as needed for mild pain. Patient not taking: Reported  on 11/29/2019 09/10/19   Duffy Bruce, MD  metFORMIN (GLUCOPHAGE) 500 MG tablet Take 1 tablet (500 mg total) by mouth daily with breakfast. 10/08/21   Juline Patch, MD  sertraline (ZOLOFT) 25 MG tablet TAKE 1 TABLET (25 MG TOTAL) BY MOUTH DAILY. 02/16/22   Juline Patch, MD    Family History Family History  Problem Relation Age of Onset   Diabetes Sister    Stroke Maternal Grandfather    Diabetes Maternal Grandfather    Hypertension Maternal Grandfather    Cancer Paternal Grandmother    Breast cancer Paternal Grandfather     Social History Social History   Tobacco Use   Smoking status: Former   Smokeless tobacco: Never  Scientific laboratory technician Use: Never used  Substance Use Topics   Alcohol use: Yes    Comment: Occ   Drug use: Never     Allergies   Sumatriptan and  Latex   Review of Systems Review of Systems  HENT:  Positive for congestion.   Respiratory:  Positive for cough.      Physical Exam Triage Vital Signs ED Triage Vitals [03/01/22 1254]  Enc Vitals Group     BP      Pulse      Resp      Temp      Temp src      SpO2      Weight      Height      Head Circumference      Peak Flow      Pain Score 9     Pain Loc      Pain Edu?      Excl. in [place]?    No data found.  Updated Vital Signs BP (!) 149/84 (BP Location: Left Arm)   Pulse 61   Temp 99.1 F (37.3 C) (Oral)   Resp 18   LMP 09/28/2019   SpO2 96%   Visual Acuity Right Eye Distance:   Left Eye Distance:   Bilateral Distance:    Right Eye Near:   Left Eye Near:    Bilateral Near:     Physical Exam Vitals and nursing note reviewed.  Constitutional:      General: She is not in acute distress.    Appearance: She is well-developed. She is not ill-appearing.  HENT:     Head: Normocephalic and atraumatic.     Right Ear: Tympanic membrane and ear canal normal.     Left Ear: Tympanic membrane and ear canal normal.     Nose: Congestion present.     Mouth/Throat:     Mouth: Mucous membranes are moist.     Pharynx: Oropharynx is clear. Uvula midline. No oropharyngeal exudate or posterior oropharyngeal erythema.     Tonsils: No tonsillar exudate or tonsillar abscesses.  Eyes:     Conjunctiva/sclera: Conjunctivae normal.     Pupils: Pupils are equal, round, and reactive to light.  Cardiovascular:     Rate and Rhythm: Normal rate and regular rhythm.     Heart sounds: Normal heart sounds.  Pulmonary:     Effort: Pulmonary effort is normal.     Breath sounds: Normal breath sounds.  Musculoskeletal:     Cervical back: Normal range of motion and neck supple.  Lymphadenopathy:     Cervical: No cervical adenopathy.  Skin:    General: Skin is warm and dry.  Neurological:     General: No focal deficit present.  Mental Status: She is alert and oriented to person,  place, and time.  Psychiatric:        Mood and Affect: Mood normal.        Behavior: Behavior normal.      UC Treatments / Results  Labs (all labs ordered are listed, but only abnormal results are displayed) Labs Reviewed - No data to display  EKG   Radiology No results found.  Procedures Procedures (including critical care time)  Medications Ordered in UC Medications - No data to display  Initial Impression / Assessment and Plan / UC Course  I have reviewed the triage vital signs and the nursing notes.  Pertinent labs & imaging results that were available during my care of the patient were reviewed by me and considered in my medical decision making (see chart for details).     Reviewed exam and symptoms with patient.  No red flags on exam.  Start Augmentin twice daily for 7 days Tessalon as needed cough Continue tea and honey Rest and fluids PCP follow-up 2 to 3 days for recheck ER precautions reviewed and patient verbalized understanding Final Clinical Impressions(s) / UC Diagnoses   Final diagnoses:  Acute bronchitis, unspecified organism     Discharge Instructions      Augmentin twice daily for 7 days Tessalon as needed for cough Continue tea and honey Rest and fluids Follow-up with your PCP 2 to 3 days for recheck Please go to the emergency room if you have any worsening symptoms    ED Prescriptions     Medication Sig Dispense Auth. Provider   amoxicillin-clavulanate (AUGMENTIN) 875-125 MG tablet Take 1 tablet by mouth every 12 (twelve) hours. 14 tablet Melynda Ripple, NP   benzonatate (TESSALON) 200 MG capsule Take 1 capsule (200 mg total) by mouth 3 (three) times daily as needed for cough. 20 capsule Melynda Ripple, NP      PDMP not reviewed this encounter.   Melynda Ripple, NP 03/01/22 1327

## 2022-03-01 NOTE — Discharge Instructions (Addendum)
Augmentin twice daily for 7 days Tessalon as needed for cough Continue tea and honey Rest and fluids Follow-up with your PCP 2 to 3 days for recheck Please go to the emergency room if you have any worsening symptoms

## 2022-03-01 NOTE — ED Triage Notes (Signed)
Patient presents to UC for cough x 2 weeks. States now some SOB and upper back pain. Taking tea and honey. Does not tolerate OTC cough meds.  Denies fever.

## 2022-03-22 ENCOUNTER — Other Ambulatory Visit: Payer: Self-pay | Admitting: Family Medicine

## 2022-03-22 DIAGNOSIS — E119 Type 2 diabetes mellitus without complications: Secondary | ICD-10-CM

## 2022-04-11 ENCOUNTER — Ambulatory Visit: Payer: 59 | Admitting: Family Medicine

## 2022-04-15 ENCOUNTER — Encounter: Payer: Self-pay | Admitting: Family Medicine

## 2022-04-19 ENCOUNTER — Ambulatory Visit: Payer: 59 | Admitting: Family Medicine

## 2022-04-20 ENCOUNTER — Other Ambulatory Visit: Payer: Self-pay | Admitting: Family Medicine

## 2022-04-20 DIAGNOSIS — E119 Type 2 diabetes mellitus without complications: Secondary | ICD-10-CM

## 2022-04-20 DIAGNOSIS — F32A Depression, unspecified: Secondary | ICD-10-CM

## 2022-05-05 ENCOUNTER — Other Ambulatory Visit: Payer: Self-pay | Admitting: Family Medicine

## 2022-05-05 DIAGNOSIS — F32A Depression, unspecified: Secondary | ICD-10-CM

## 2022-05-05 DIAGNOSIS — E119 Type 2 diabetes mellitus without complications: Secondary | ICD-10-CM

## 2022-05-05 NOTE — Telephone Encounter (Signed)
Requested medication (s) are due for refill today: yes  Requested medication (s) are on the active medication list: yes  Last refill:  04/20/22  Future visit scheduled: yes  Notes to clinic:  [place]. DX Code Needed.      Requested Prescriptions  Pending Prescriptions Disp Refills   sertraline (ZOLOFT) 25 MG tablet [Pharmacy Med Name: SERTRALINE HCL 25 MG TABLET] 90 tablet 1    Sig: TAKE 1 TABLET (25 MG TOTAL) BY MOUTH DAILY.     Psychiatry:  Antidepressants - SSRI - sertraline Failed - 05/05/2022  1:30 PM      Failed - AST in normal range and within 360 days    AST  Date Value Ref Range Status  10/14/2019 18 15 - 41 U/L Final   SGOT(AST)  Date Value Ref Range Status  12/14/2012 23 15 - 37 Unit/L Final         Failed - ALT in normal range and within 360 days    ALT  Date Value Ref Range Status  10/14/2019 14 0 - 44 U/L Final   SGPT (ALT)  Date Value Ref Range Status  12/14/2012 16 12 - 78 U/L Final         Failed - Valid encounter within last 6 months    Recent Outpatient Visits           7 months ago Anxiety and depression   [place] [facility] [place], [place], MD   8 months ago Iron deficiency anemia, unspecified iron deficiency anemia type   [place] [facility] [place], [place], MD       Future Appointments             In 5 days Juline Patch, MD [place] at [facility], [place]   In 3 months Juline Patch, MD [place] at [facility], [place] - Completed PHQ-2 or PHQ-9 in the last 360 days       metFORMIN (GLUCOPHAGE) 500 MG tablet [Pharmacy Med Name: METFORMIN HCL 500 MG TABLET] 90 tablet 1    Sig: TAKE 1 [place]     Endocrinology:  Diabetes - Biguanides Failed - 05/05/2022  1:30 PM      Failed - Cr in normal range and  within 360 days    Creatinine  Date Value Ref Range Status  05/28/2014 0.80 mg/dL Final    Comment:    0.44-1.00 NOTE: New Reference Range  05/06/14    Creatinine, Ser  Date Value Ref Range Status  10/07/2021 1.05 (H) 0.57 - 1.00 mg/dL Final   Creatinine, Urine  Date Value Ref Range Status  10/16/2019 201 mg/dL Final    Comment:    Performed at [facility], [place] [place], [place] [address]         Failed - HBA1C is between 0 and 7.9 and within 180 days    Hgb A1c MFr Bld  Date Value Ref Range Status  10/07/2021 6.5 (H) 4.8 - 5.6 % Final    Comment:             Prediabetes: 5.7 - 6.4          Diabetes: >6.4  Glycemic control for adults with diabetes: <7.0          Failed - B12 Level in normal range and within 720 days    No results found for: "VITAMINB12"       Failed - Valid encounter within last 6 months    Recent Outpatient Visits           7 months ago Anxiety and depression   [facility] at [place], [place], MD   8 months ago Iron deficiency anemia, unspecified iron deficiency anemia type   [place] [facility] at [place], [place], MD       Future Appointments             In 5 days Juline Patch, MD [place] at [facility], [place]   In 3 months Juline Patch, MD [place] at [facility], [place] - eGFR in normal range and within 360 days    EGFR (African American)  Date Value Ref Range Status  05/28/2014 >60  Final   GFR calc Af Amer  Date Value Ref Range Status  10/18/2019 >60 >60 mL/min Final   EGFR (Non-African Amer.)  Date Value Ref Range Status  05/28/2014 >60  Final    Comment:    eGFR values <69m/min/1.73 m2 may be an indication of chronic kidney disease (CKD). Calculated eGFR is useful in patients with stable renal function. The  eGFR calculation will not be reliable in acutely ill patients when serum creatinine is changing rapidly. It is not useful in patients on dialysis. The eGFR calculation may not be applicable to patients at the low and high extremes of body sizes, pregnant women, and vegetarians.    GFR calc non Af Amer  Date Value Ref Range Status  10/18/2019 >60 >60 mL/min Final   eGFR  Date Value Ref Range Status  10/07/2021 64 >59 mL/min/1.73 Final         Passed - CBC within normal limits and completed in the last 12 months    WBC  Date Value Ref Range Status  10/07/2021 8.3 3.4 - 10.8 x10E3/uL Final  10/18/2019 7.3 4.0 - 10.5 K/uL Final   RBC  Date Value Ref Range Status  10/07/2021 5.00 3.77 - 5.28 x10E6/uL Final  10/18/2019 3.98 3.87 - 5.11 MIL/uL Final   Hemoglobin  Date Value Ref Range Status  10/07/2021 12.8 11.1 - 15.9 g/dL Final   Hematocrit  Date Value Ref Range Status  10/07/2021 39.4 34.0 - 46.6 % Final   MCHC  Date Value Ref Range Status  10/07/2021 32.5 31.5 - 35.7 g/dL Final  10/18/2019 31.9 30.0 - 36.0 g/dL Final   [facility] Date Value Ref Range Status  10/07/2021 25.6 (L) 26.6 - 33.0 pg Final  10/18/2019 24.6 (L) 26.0 - 34.0 pg Final   MCV  Date Value Ref Range Status  10/07/2021 79 79 - 97 fL Final  05/28/2014 63 (L) 80 - 100 fL Final   No results found for: "PLTCOUNTKUC", "LABPLAT", "POCPLA" RDW  Date Value Ref Range Status  10/07/2021 15.2 11.7 - 15.4 % Final  05/28/2014 20.0 (H) 11.5 - 14.5 % Final

## 2022-05-10 ENCOUNTER — Encounter: Payer: Self-pay | Admitting: Family Medicine

## 2022-05-10 ENCOUNTER — Ambulatory Visit (INDEPENDENT_AMBULATORY_CARE_PROVIDER_SITE_OTHER): Payer: 59 | Admitting: Family Medicine

## 2022-05-10 VITALS — BP 128/62 | HR 90 | Ht 59.0 in | Wt 193.0 lb

## 2022-05-10 DIAGNOSIS — F419 Anxiety disorder, unspecified: Secondary | ICD-10-CM | POA: Diagnosis not present

## 2022-05-10 DIAGNOSIS — E119 Type 2 diabetes mellitus without complications: Secondary | ICD-10-CM

## 2022-05-10 DIAGNOSIS — F32A Depression, unspecified: Secondary | ICD-10-CM

## 2022-05-10 DIAGNOSIS — D509 Iron deficiency anemia, unspecified: Secondary | ICD-10-CM

## 2022-05-10 MED ORDER — METFORMIN HCL 500 MG PO TABS: ORAL_TABLET | ORAL | 1 refills | Status: DC

## 2022-05-10 MED ORDER — SERTRALINE HCL 25 MG PO TABS: 25.0000 mg | ORAL_TABLET | Freq: Every day | ORAL | 1 refills | Status: DC

## 2022-05-10 NOTE — Progress Notes (Addendum)
Date:  05/10/2022   Name:  Courtney Cruz   DOB:  24-Apr-1970   MRN:  [id number]   Chief Complaint: Depression, Diabetes, and Anemia  Depression        This is a chronic problem.  The current episode started more than 1 year ago.   The onset quality is gradual.   The problem has been waxing and waning since onset.  Associated symptoms include no decreased concentration, no fatigue, no helplessness, no hopelessness, does not have insomnia, not irritable, no restlessness, no decreased interest, no appetite change, no body aches, no myalgias, no headaches, no indigestion, not sad and no suicidal ideas.  Past treatments include SSRIs - Selective serotonin reuptake inhibitors.  Compliance with treatment is good.  Previous treatment provided moderate relief.   Pertinent negatives include no chronic fatigue syndrome. Diabetes She presents for her follow-up diabetic visit. She has type 2 diabetes mellitus. Her disease course has been stable. Pertinent negatives for hypoglycemia include no headaches or nervousness/anxiousness. Pertinent negatives for diabetes include no chest pain, no fatigue, no foot ulcerations, no polydipsia, no polyphagia, no polyuria, no visual change and no weight loss. There are no hypoglycemic complications. Symptoms are stable. There are no diabetic complications. Current diabetic treatment includes oral agent (monotherapy) (not taking meds daily). She is following a generally healthy diet. Meal planning includes avoidance of concentrated sweets and carbohydrate counting. She participates in exercise intermittently.  Anemia Presents for follow-up visit. There has been no abdominal pain, bruising/bleeding easily, malaise/fatigue, palpitations or weight loss. Signs of blood loss that are present include hematochezia. Signs of blood loss that are not present include hematemesis, menorrhagia and vaginal bleeding. There are no compliance problems.     Lab Results  Component Value Date    NA 140 10/07/2021   K 4.4 10/07/2021   CO2 23 10/07/2021   GLUCOSE 111 (H) 10/07/2021   BUN 17 10/07/2021   CREATININE 1.05 (H) 10/07/2021   CALCIUM 9.6 10/07/2021   EGFR 64 10/07/2021   GFRNONAA >60 10/18/2019   Lab Results  Component Value Date   CHOL 181 10/07/2021   HDL 65 10/07/2021   LDLCALC 97 10/07/2021   TRIG 105 10/07/2021   No results found for: "TSH" Lab Results  Component Value Date   HGBA1C 6.5 (H) 10/07/2021   Lab Results  Component Value Date   WBC 8.3 10/07/2021   HGB 12.8 10/07/2021   HCT 39.4 10/07/2021   MCV 79 10/07/2021   PLT 295 10/07/2021   Lab Results  Component Value Date   ALT 14 10/14/2019   AST 18 10/14/2019   ALKPHOS 64 10/14/2019   BILITOT 0.5 10/14/2019   No results found for: "25OHVITD2", "25OHVITD3", "VD25OH"   Review of Systems  Constitutional:  Negative for appetite change, fatigue, malaise/fatigue, unexpected weight change and weight loss.  HENT:  Negative for trouble swallowing.   Respiratory:  Negative for choking, chest tightness, shortness of breath and wheezing.   Cardiovascular:  Negative for chest pain, palpitations and leg swelling.  Gastrointestinal:  Positive for hematochezia. Negative for abdominal pain, anal bleeding and hematemesis.  Endocrine: Negative for polydipsia, polyphagia and polyuria.  Genitourinary:  Negative for difficulty urinating, menorrhagia, menstrual problem and vaginal bleeding.  Musculoskeletal:  Negative for myalgias.  Neurological:  Negative for headaches.  Hematological:  Does not bruise/bleed easily.  Psychiatric/Behavioral:  Positive for depression. Negative for decreased concentration and suicidal ideas. The patient is not nervous/anxious and does not have insomnia.  Patient Active Problem List   Diagnosis Date Noted   Encounter for screening colonoscopy    Polyp of transverse colon    Cervical mass 10/16/2019   Status post abdominal hysterectomy 10/16/2019   Menorrhagia  09/25/2019   Uterine mass 09/25/2019    Allergies  Allergen Reactions   Sumatriptan Anaphylaxis and Shortness Of Breath   Latex Rash    Past Surgical History:  Procedure Laterality Date   CESAREAN SECTION     COLONOSCOPY WITH PROPOFOL N/A 09/09/2021   Procedure: COLONOSCOPY WITH PROPOFOL;  Surgeon: Lucilla Lame, MD;  Location: [facility];  Service: Endoscopy;  Laterality: N/A;   HYSTERECTOMY ABDOMINAL WITH SALPINGECTOMY Bilateral 10/16/2019   Procedure: HYSTERECTOMY ABDOMINAL WITH SALPINGECTOMY;  Surgeon: Will Bonnet, MD;  Location: [facility];  Service: Gynecology;  Laterality: Bilateral;   LAPAROTOMY WITH STAGING N/A 10/16/2019   Procedure: LAPAROTOMY EXPLORATORY;  Surgeon: Will Bonnet, MD;  Location: [facility];  Service: Gynecology;  Laterality: N/A;    Social History   Tobacco Use   Smoking status: Former   Smokeless tobacco: Never  Scientific laboratory technician Use: Never used  Substance Use Topics   Alcohol use: Yes    Comment: Occ   Drug use: Never     Medication list has been reviewed and updated.  Current Meds  Medication Sig   metFORMIN (GLUCOPHAGE) 500 MG tablet TAKE 1 TABLET BY MOUTH EVERY DAY WITH BREAKFAST   sertraline (ZOLOFT) 25 MG tablet TAKE 1 TABLET (25 MG TOTAL) BY MOUTH DAILY.       05/10/2022    3:03 PM 10/07/2021    2:35 PM 08/19/2021    2:13 PM  GAD 7 : Generalized Anxiety Score  Nervous, Anxious, on Edge 0 0 1  Control/stop worrying 0 0 1  Worry too much - different things 0 0 1  Trouble relaxing 0 0 1  Restless 0 0 0  Easily annoyed or irritable 0 0 1  Afraid - awful might happen 0 0 0  Total GAD 7 Score 0 0 5  Anxiety Difficulty Not difficult at all  Not difficult at all       05/10/2022    3:03 PM 10/07/2021    2:34 PM 08/19/2021    2:13 PM  Depression screen PHQ 2/9  Decreased Interest 0 0 1  Down, Depressed, Hopeless 0 0 2  PHQ - 2 Score 0 0 3  Altered sleeping 0 0 1  Tired, decreased energy 0 0 2  Change in  appetite 0 0 2  Feeling bad or failure about yourself  0 0 1  Trouble concentrating 0 0 0  Moving slowly or fidgety/restless 0 0 0  Suicidal thoughts 0 0 0  PHQ-9 Score 0 0 9  Difficult doing work/chores Not difficult at all Not difficult at all Somewhat difficult    BP Readings from Last 3 Encounters:  05/10/22 128/62  03/01/22 (!) 149/84  10/07/21 120/80    Physical Exam Vitals and nursing note reviewed. Exam conducted with a chaperone present.  Constitutional:      General: She is not irritable.She is not in acute distress.    Appearance: She is not diaphoretic.  HENT:     Head: Normocephalic and atraumatic.     Right Ear: External ear normal.     Left Ear: External ear normal.     Nose: Nose normal.  Eyes:     General:        Right eye:  No discharge.        Left eye: No discharge.     Conjunctiva/sclera: Conjunctivae normal.     Pupils: Pupils are equal, round, and reactive to light.  Neck:     Thyroid: No thyromegaly.     Vascular: No JVD.  Cardiovascular:     Rate and Rhythm: Normal rate and regular rhythm.     Heart sounds: Normal heart sounds. No murmur heard.    No friction rub. No gallop.  Pulmonary:     Effort: Pulmonary effort is normal.     Breath sounds: Normal breath sounds.  Abdominal:     General: Bowel sounds are normal.     Palpations: Abdomen is soft. There is no mass.     Tenderness: There is no abdominal tenderness. There is no guarding.  Musculoskeletal:        General: Normal range of motion.     Cervical back: Normal range of motion and neck supple.  Lymphadenopathy:     Cervical: No cervical adenopathy.  Skin:    General: Skin is warm and dry.  Neurological:     Mental Status: She is alert.     Wt Readings from Last 3 Encounters:  05/10/22 193 lb (87.5 kg)  10/07/21 194 lb (88 kg)  09/09/21 187 lb 12.8 oz (85.2 kg)    BP 128/62 (BP Location: Right Arm, Cuff Size: Large)   Pulse 90   Ht '4\' 11"'$  (1.499 m)   Wt 193 lb (87.5 kg)    LMP 09/28/2019   SpO2 97%   BMI 38.98 kg/m   Assessment and Plan: 1. New onset type 2 diabetes mellitus (HCC) Chronic.  Uncontrolled.  Stable.  Patient has not been taking metformin on a regular basis and we have reemphasized the importance of taking it on a daily basis and nutrition and this is been provided as well.  We will hold on A1c and have patient come back in 4 months fasting and in the morning so we can also check lipids.  We will refill metformin 500 mg once a day and will recheck patient as directed.  We have also added a microalbuminuria with ratio to assess for diabetic nephropathy. - metFORMIN (GLUCOPHAGE) 500 MG tablet; TAKE 1 TABLET BY MOUTH EVERY DAY WITH BREAKFAST  Dispense: 90 tablet; Refill: 1  2. Anxiety and depression Chronic.  Controlled.  Stable.  PHQ is 0 GAD score 0 continue sertraline 25 mg daily we will recheck in 4 months. - sertraline (ZOLOFT) 25 MG tablet; Take 1 tablet (25 mg total) by mouth daily.  Dispense: 90 tablet; Refill: 1  3. Iron deficiency anemia, unspecified iron deficiency anemia type Patient is off-and-on had some rectal bleeding but colonoscopy showed that it was most likely secondary to internal hemorrhoids.  We will check a CBC if this is been adversely affected and we will resume iron supplementation 325 mg once a day if necessary. - CBC with Differential/Platelet     Otilio Miu, MD

## 2022-05-10 NOTE — Patient Instructions (Signed)
GUIDELINES FOR  LOW-CHOLESTEROL, LOW-TRIGLYCERIDE DIETS    FOODS TO USE   MEATS, FISH Choose lean meats (chicken, [place], veal, and non-fatty cuts of beef with excess fat trimmed; one serving = 3 oz of cooked meat). Also, fresh or frozen fish, canned fish packed in water, and shellfish (lobster, crabs, shrimp, and oysters). Limit use to no more than one serving of one of these per week. Shellfish are high in cholesterol but low in saturated fat and should be used sparingly. Meats and fish should be broiled (pan or oven) or baked on a rack.  EGGS Egg substitutes and egg whites (use freely). Egg yolks (limit two per week).  FRUITS Eat three servings of fresh fruit per day (1 serving =  cup). Be sure to have at least one citrus fruit daily. Frozen and canned fruit with no sugar or syrup added may be used.  VEGETABLES Most vegetables are not limited (see next page). One dark-green (string beans, escarole) or one deep yellow (squash) vegetable is recommended daily. Cauliflower, broccoli, and celery, as well as potato skins, are recommended for their fiber content. (Fiber is associated with cholesterol reduction) It is preferable to steam vegetables, but they may be boiled, strained, or braised with polyunsaturated vegetable oil (see below).  BEANS Dried peas or beans (1 serving =  cup) may be used as a bread substitute.  NUTS Almonds, walnuts, and peanuts may be used sparingly  (1 serving = 1 Tablespoonful). Use pumpkin, sesame, or sunflower seeds.  BREADS, GRAINS One roll or one slice of whole grain or enriched bread may be used, or three soda crackers or four pieces of melba toast as a substitute. Spaghetti, rice or noodles ( cup) or  large ear of corn may be used as a bread substitute. In preparing these foods do not use butter or shortening, use soft margarine. Also use egg and sugar substitutes.  Choose high fiber grains, such as oats and whole wheat.  CEREALS Use  cup of hot cereal or  cup of  cold cereal per day. Add a sugar substitute if desired, with 99% fat free or skim milk.  MILK PRODUCTS Always use 99% fat free or skim milk, dairy products such as low fat cheeses (farmer's uncreamed diet cottage), low-fat yogurt, and powdered skim milk.  FATS, OILS Use soft (not stick) margarine; vegetable oils that are high in polyunsaturated fats (such as safflower, sunflower, soybean, corn, and cottonseed). Always refrigerate meat drippings to harden the fat and remove it before preparing gravies  DESSERTS, SNACKS Limit to two servings per day; substitute each serving for a bread/cereal serving: ice milk, water sherbet (1/4 cup); unflavored gelatin or gelatin flavored with sugar substitute (1/3 cup); pudding prepared with skim milk (1/2 cup); egg white souffls; unbuttered popcorn (1  cups). Substitute carob for chocolate.  BEVERAGES Fresh fruit juices (limit 4 oz per day); black coffee, plain or herbal teas; soft drinks with sugar substitutes; club soda, preferably salt-free; cocoa made with skim milk or nonfat dried milk and water (sugar substitute added if desired); clear broth. Alcohol: limit two servings per day (see second page).  MISCELLANEOUS  You may use the following freely: vinegar, spices, herbs, nonfat bouillon, mustard, Worcestershire sauce, soy sauce, flavoring essence.                  GUIDELINES FOR  LOW-CHOLESTEROL, LOW TRIGLYCERIDE DIETS    FOODS TO AVOID   MEATS, FISH Marbled beef, pork, bacon, sausage, and other pork products; fatty  fowl (duck, goose); skin and fat of [place] and chicken; processed meats; luncheon meats (salami, bologna); frankfurters and fast-food hamburgers (theyre loaded with fat); organ meats (kidneys, liver); canned fish packed in oil.  EGGS Limit egg yolks to two per week.   FRUITS Coconuts (rich in saturated fats).  VEGETABLES Avoid avocados. Starchy vegetables (potatoes, corn, lima beans, dried peas, beans) may be used only if  substitutes for a serving of bread or cereal. (Baked potato skin, however, is desirable for its fiber content.  BEANS Commercial baked beans with sugar and/or pork added.  NUTS Avoid nuts.  Limit peanuts and walnuts to one tablespoonful per day.  BREADS, GRAINS Any baked goods with shortening and/or sugar. Commercial mixes with dried eggs and whole milk. Avoid sweet rolls, doughnuts, breakfast pastries (Danish), and sweetened packaged cereals (the added sugar converts readily to triglycerides).  MILK PRODUCTS Whole milk and whole-milk packaged goods; cream; ice cream; whole-milk puddings, yogurt, or cheeses; nondairy cream substitutes.  FATS, OILS Butter, lard, animal fats, bacon drippings, gravies, cream sauces as well as palm and coconut oils. All these are high in saturated fats. Examine labels on cholesterol free products for hydrogenated fats. (These are oils that have been hardened into solids and in the process have become saturated.)  DESSERTS, SNACKS Fried snack foods like potato chips; chocolate; candies in general; jams, jellies, syrups; whole- milk puddings; ice cream and milk sherbets; hydrogenated peanut butter.  BEVERAGES Sugared fruit juices and soft drinks; cocoa made with whole milk and/or sugar. When using alcohol (1 oz liquor, 5 oz beer, or 2  oz dry table wine per serving), one serving must be substituted for one bread or cereal serving (limit, two servings of alcohol per day).   SPECIAL NOTES    Remember that even non-limited foods should be used in moderation. While on a cholesterol-lowering diet, be sure to avoid animal fats and marbled meats. 3. While on a triglyceride-lowering diet, be sure to avoid sweets and to control the amount of carbohydrates you eat (starchy foods such as flour, bread, potatoes).While on a tri-glyceride-lowering diet, be sure to avoid sweets Buy a good low-fat cookbook, such as the one published by the American Heart Association. Consult your physician  if you have any questions.               [facility] Low Glycemic Diet Plan   Low Glycemic Foods (20-49) Moderate Glycemic Foods (50-69) High Glycemic Foods (70-100)      Breakfast Creals Breakfast Cereals Breakfast Cereals  All Bran All-Bran Fruit'n Oats   Bran Buds Bran Chex   Cheerios Corn chex    Fiber One Oatmeal (not instant)   Just Right Mini-Wheats   Corn Flakes Cream of Wheat    Oat Bran Special K Swiss Muesli   Grape Nuts Grape Nut Flakes      Grits Nutri-Grain    Fruits and fruit juice: Fruits Puffed Rice Puffed Wheat    (Limit to 1-2 Servings per day) Banana (under-ride) Dates   Rice Chex Rice Krispies    Apples Apricots (fresh/dried)   Figs Grapes   Shredded Wheat Team    Blackberries Blueberries   Kiwi Mango   Total     Cherries Cranberries   Oranges Raisins     Peaches Pears    Fruits  Plums Prunes   Fruit Juices Pineapple Watermelon    Grapefruit Raspberries   Cranberry Juice Orange Juice   Banana (over-ripe)     Strawberries Tangerines  Apple Juice Grapefruit Juice   Beans and Legumes Beverages  Tomato Juice    [place]-type baked beans Sodas, sweet tea, pineapple juice   Canned pinto, kidney, or navy beans   Beans and Legumes (fresh-cooked) Green peas Vegetables  Black-eyed peas Butter Beans    Potato, baked, boiled, fried, mashed  Chick peas Lentils   Vegetables [place] fries  Green beans Lima beans   Beets Carrots   Canned or frozen corn  Kidney beans Navy beans   Sweet potato Yam   Parsnips  Pinto beans Snow peas   Corn on the cob Winter squash      Non-starchy vegetables Grains Breads  Asparagus, avocado, broccoli, cabbage Cornmeal Rice, brown   Most breads (white and whole grain)  cauliflower, celery, cucumber, greens Rice, white Couscous   Bagels Bread sticks    lettuce, mushrooms, peppers, tomatoes  Bread stuffing Kaiser roll    okra, onions, spinach, summer squash Pasta Dinner rolls   [employer], cheese     Grains Ravioli, meat filled Spaghetti, white   Grains  Barley Bulgur    Rice, instant Tapioca, with milk    Rye Wild rice   Nuts    Cashews Macadamia   Candy and most cookies  Nuts and oils    Almonds, peanuts, sunflower seeds Snacks Snacks  hazelnuts, pecans, walnuts Chocolate Ice cream, lowfat   Donuts Corn chips    Oils that are liquid at room temperature Muffin Popcorn   Jelly beans Pretzels      Pastries  Dairy, fish, meat, soy, and eggs    Milk, skim Lowfat cheese    Restaurant and ethnic foods  Yogurt, lowfat, fruit sugar sweetened  Most [place] food (sugar in stir fry    or wok sauce)  Lean red meat Fish    Teriyaki-style meats and vegetables  Skinless chicken and [place], shellfish        Egg whites (up to 3 daily), Soy Products    Egg yolks (up to 7 or _____ per week)     Diabetes Mellitus and Nutrition, Adult When you have diabetes, or diabetes mellitus, it is very important to have healthy eating habits because your blood sugar (glucose) levels are greatly affected by what you eat and drink. Eating healthy foods in the right amounts, at about the same times every day, can help you: Manage your blood glucose. Lower your risk of heart disease. Improve your blood pressure. Reach or maintain a healthy weight. What can affect my meal plan? Every person with diabetes is different, and each person has different needs for a meal plan. Your health care provider may recommend that you work with a dietitian to make a meal plan that is best for you. Your meal plan may vary depending on factors such as: The calories you need. The medicines you take. Your weight. Your blood glucose, blood pressure, and cholesterol levels. Your activity level. Other health conditions you have, such as heart or kidney disease. How do carbohydrates affect me? Carbohydrates, also called carbs, affect your blood glucose level more than any other type of food. Eating carbs  raises the amount of glucose in your blood. It is important to know how many carbs you can safely have in each meal. This is different for every person. Your dietitian can help you calculate how many carbs you should have at each meal and for each snack. How does alcohol affect me? Alcohol can cause a decrease in blood glucose (hypoglycemia), especially  if you use insulin or take certain diabetes medicines by mouth. Hypoglycemia can be a life-threatening condition. Symptoms of hypoglycemia, such as sleepiness, dizziness, and confusion, are similar to symptoms of having too much alcohol. Do not drink alcohol if: Your health care provider tells you not to drink. You are pregnant, may be pregnant, or are planning to become pregnant. If you drink alcohol: Limit how much you have to: 0-1 drink a day for women. 0-2 drinks a day for men. Know how much alcohol is in your drink. In the [place] one drink equals one 12 oz bottle of beer (355 mL), one 5 oz glass of wine (148 mL), or one 1 oz glass of hard liquor (44 mL). Keep yourself hydrated with water, diet soda, or unsweetened iced tea. Keep in mind that regular soda, juice, and other mixers may contain a lot of sugar and must be counted as carbs. What are tips for following this plan?  Reading food labels Start by checking the serving size on the Nutrition Facts label of packaged foods and drinks. The number of calories and the amount of carbs, fats, and other nutrients listed on the label are based on one serving of the item. Many items contain more than one serving per package. Check the total grams (g) of carbs in one serving. Check the number of grams of saturated fats and trans fats in one serving. Choose foods that have a low amount or none of these fats. Check the number of milligrams (mg) of salt (sodium) in one serving. Most people should limit total sodium intake to less than 2,300 mg per day. Always check the nutrition information of foods  labeled as "low-fat" or "nonfat." These foods may be higher in added sugar or refined carbs and should be avoided. Talk to your dietitian to identify your daily goals for nutrients listed on the label. Shopping Avoid buying canned, pre-made, or processed foods. These foods tend to be high in fat, sodium, and added sugar. Shop around the outside edge of the grocery store. This is where you will most often find fresh fruits and vegetables, bulk grains, fresh meats, and fresh dairy products. Cooking Use low-heat cooking methods, such as baking, instead of high-heat cooking methods, such as deep frying. Cook using healthy oils, such as olive, canola, or sunflower oil. Avoid cooking with butter, cream, or high-fat meats. Meal planning Eat meals and snacks regularly, preferably at the same times every day. Avoid going long periods of time without eating. Eat foods that are high in fiber, such as fresh fruits, vegetables, beans, and whole grains. Eat 4-6 oz (112-168 g) of lean protein each day, such as lean meat, chicken, fish, eggs, or tofu. One ounce (oz) (28 g) of lean protein is equal to: 1 oz (28 g) of meat, chicken, or fish. 1 egg.  cup (62 g) of tofu. Eat some foods each day that contain healthy fats, such as avocado, nuts, seeds, and fish. What foods should I eat? Fruits Berries. Apples. Oranges. Peaches. Apricots. Plums. Grapes. Mangoes. Papayas. Pomegranates. Kiwi. Cherries. Vegetables Leafy greens, including lettuce, spinach, kale, chard, collard greens, mustard greens, and cabbage. Beets. Cauliflower. Broccoli. Carrots. Green beans. Tomatoes. Peppers. Onions. Cucumbers. Brussels sprouts. Grains Whole grains, such as whole-wheat or whole-grain bread, crackers, tortillas, cereal, and pasta. Unsweetened oatmeal. Quinoa. Brown or wild rice. Meats and other proteins Seafood. Poultry without skin. Lean cuts of poultry and beef. Tofu. Nuts. Seeds. Dairy Low-fat or fat-free dairy products  such as milk,  yogurt, and cheese. The items listed above may not be a complete list of foods and beverages you can eat and drink. Contact a dietitian for more information. What foods should I avoid? Fruits Fruits canned with syrup. Vegetables Canned vegetables. Frozen vegetables with butter or cream sauce. Grains Refined white flour and flour products such as bread, pasta, snack foods, and cereals. Avoid all processed foods. Meats and other proteins Fatty cuts of meat. Poultry with skin. Breaded or fried meats. Processed meat. Avoid saturated fats. Dairy Full-fat yogurt, cheese, or milk. Beverages Sweetened drinks, such as soda or iced tea. The items listed above may not be a complete list of foods and beverages you should avoid. Contact a dietitian for more information. Questions to ask a health care provider Do I need to meet with a certified diabetes care and education specialist? Do I need to meet with a dietitian? What number can I call if I have questions? When are the best times to check my blood glucose? Where to find more information: American Diabetes Association: diabetes.org Academy of Nutrition and Dietetics: eatright.[employer] of Diabetes and Digestive and Kidney Diseases: [employer]en[employer].is Association of Diabetes Care & Education Specialists: diabeteseducator.org Summary It is important to have healthy eating habits because your blood sugar (glucose) levels are greatly affected by what you eat and drink. It is important to use alcohol carefully. A healthy meal plan will help you manage your blood glucose and lower your risk of heart disease. Your health care provider may recommend that you work with a dietitian to make a meal plan that is best for you. This information is not intended to replace advice given to you by your health care provider. Make sure you discuss any questions you have with your health care provider. Document Revised: 09/18/2019 Document  Reviewed: 09/18/2019 Elsevier Patient Education  [place].

## 2022-05-10 NOTE — Addendum Note (Signed)
Addended by: Juline Patch on: 05/10/2022 03:48 PM   Modules accepted: Orders

## 2022-05-11 LAB — MICROALBUMIN / CREATININE URINE RATIO
Creatinine, Urine: 157.5 mg/dL
Microalb/Creat Ratio: 3 mg/g creat (ref 0–29)
Microalbumin, Urine: 4.8 ug/mL

## 2022-05-11 LAB — CBC WITH DIFFERENTIAL/PLATELET
Basophils Absolute: 0.1 10*3/uL (ref 0.0–0.2)
Basos: 1 %
EOS (ABSOLUTE): 0.3 10*3/uL (ref 0.0–0.4)
Eos: 4 %
Hematocrit: 41.5 % (ref 34.0–46.6)
Hemoglobin: 13.3 g/dL (ref 11.1–15.9)
Immature Grans (Abs): 0 10*3/uL (ref 0.0–0.1)
Immature Granulocytes: 0 %
Lymphocytes Absolute: 2.5 10*3/uL (ref 0.7–3.1)
Lymphs: 33 %
MCH: 26.3 pg — ABNORMAL LOW (ref 26.6–33.0)
MCHC: 32 g/dL (ref 31.5–35.7)
MCV: 82 fL (ref 79–97)
Monocytes Absolute: 0.5 10*3/uL (ref 0.1–0.9)
Monocytes: 6 %
Neutrophils Absolute: 4.4 10*3/uL (ref 1.4–7.0)
Neutrophils: 56 %
Platelets: 299 10*3/uL (ref 150–450)
RBC: 5.05 x10E6/uL (ref 3.77–5.28)
RDW: 14.9 % (ref 11.7–15.4)
WBC: 7.6 10*3/uL (ref 3.4–10.8)

## 2022-06-07 ENCOUNTER — Other Ambulatory Visit: Payer: Self-pay | Admitting: Family Medicine

## 2022-07-21 ENCOUNTER — Telehealth: Payer: 59 | Admitting: Physician Assistant

## 2022-07-21 ENCOUNTER — Encounter: Payer: Self-pay | Admitting: Family Medicine

## 2022-07-21 DIAGNOSIS — L03317 Cellulitis of buttock: Secondary | ICD-10-CM | POA: Diagnosis not present

## 2022-07-21 MED ORDER — DOXYCYCLINE HYCLATE 100 MG PO TABS: 100.0000 mg | ORAL_TABLET | Freq: Two times a day (BID) | ORAL | 0 refills | Status: DC

## 2022-07-21 MED ORDER — HYDROXYZINE PAMOATE 25 MG PO CAPS: 25.0000 mg | ORAL_CAPSULE | Freq: Three times a day (TID) | ORAL | 0 refills | Status: DC | PRN

## 2022-07-21 NOTE — Patient Instructions (Signed)
  [name], thank you for joining [name], PA-C for today's virtual visit.  While this provider is not your primary care provider (PCP), if your PCP is located in our provider database this encounter information will be shared with them immediately following your visit.   A [facility] MyChart account gives you access to today's visit and all your visits, tests, and labs performed at [facility] ([facility] " click here if you don't have a [facility] MyChart account or go to [email or web address]  Consent: (Patient) Courtney Cruz provided verbal consent for this virtual visit at the beginning of the encounter.  Current Medications:  Current Outpatient Medications:    Accu-Chek Softclix Lancets lancets, Test once daily, Disp: 100 each, Rfl: 0   acetaminophen (TYLENOL) 500 MG tablet, Take 2 tablets (1,000 mg total) by mouth every 6 (six) hours as needed for mild pain. (Patient not taking: Reported on 10/07/2021), Disp: 30 tablet, Rfl: 0   ferrous sulfate 325 (65 FE) MG EC tablet, Take 325 mg by mouth every morning.  (Patient not taking: Reported on 08/19/2021), Disp: , Rfl:    ibuprofen (ADVIL) 600 MG tablet, Take 1 tablet (600 mg total) by mouth every 8 (eight) hours as needed for mild pain. (Patient not taking: Reported on 11/29/2019), Disp: 20 tablet, Rfl: 0   metFORMIN (GLUCOPHAGE) 500 MG tablet, TAKE 1 TABLET BY MOUTH EVERY DAY WITH BREAKFAST, Disp: 90 tablet, Rfl: 1   sertraline (ZOLOFT) 25 MG tablet, Take 1 tablet (25 mg total) by mouth daily., Disp: 90 tablet, Rfl: 1   Medications ordered in this encounter:  No orders of the defined types were placed in this encounter.    *If you need refills on other medications prior to your next appointment, please contact your pharmacy*  Follow-Up: Call back or seek an in-person evaluation if the symptoms worsen or if the condition fails to improve as anticipated.  [facility] [phone]  Other  Instructions Start Doxycycline as directed. The Hydroxyzine is to help with local inflammation.  Apply Cold compresses as needed. You can use Tylenol if needed. I want you to follow-up with your primary care provider early next week to ensure this is fully resolving. If anything worsens despite treatment, contact them ASAP or be evaluated at local UC/ER facility.     If you have been instructed to have an in-person evaluation today at a local Urgent Care facility, please use the link below. It will take you to a list of all of our available [facility], including address, phone number and hours of operation. Please do not delay care.  [facility]  If you or a family member do not have a primary care provider, use the link below to schedule a visit and establish care. When you choose a [facility] primary care physician or advanced practice provider, you gain a long-term partner in health. Find a Primary Care Provider  Learn more about [facility]'s in-office and virtual care options: [facility] - Get Care Now

## 2022-07-21 NOTE — Progress Notes (Signed)
Virtual Visit Consent   Courtney Cruz, you are scheduled for a virtual visit with a [facility] provider today. Just as with appointments in the office, your consent must be obtained to participate. Your consent will be active for this visit and any virtual visit you may have with one of our providers in the next 365 days. If you have a MyChart account, a copy of this consent can be sent to you electronically.  As this is a virtual visit, video technology does not allow for your provider to perform a traditional examination. This may limit your provider's ability to fully assess your condition. If your provider identifies any concerns that need to be evaluated in person or the need to arrange testing (such as labs, EKG, etc.), we will make arrangements to do so. Although advances in technology are sophisticated, we cannot ensure that it will always work on either your end or our end. If the connection with a video visit is poor, the visit may have to be switched to a telephone visit. With either a video or telephone visit, we are not always able to ensure that we have a secure connection.  By engaging in this virtual visit, you consent to the provision of healthcare and authorize for your insurance to be billed (if applicable) for the services provided during this visit. Depending on your insurance coverage, you may receive a charge related to this service.  I need to obtain your verbal consent now. Are you willing to proceed with your visit today? Courtney Cruz has provided verbal consent on 07/21/2022 for a virtual visit (video or telephone). [name], [place]  Date: 07/21/2022 4:39 PM  Virtual Visit via Video Note  I, [name], PA-C, attempted to connect with Courtney Cruz; MRN [id number] on 07/21/22 via [employer] to complete a video urgent care visit. The patient was unable to successfully connect to the video platform. As such, the patient was contacted by this provider via  phone to complete the encounter.     Location: Patient: Virtual Visit Location Patient: Home Provider: Virtual Visit Location Provider: Home Office   I discussed the limitations of evaluation and management by telemedicine and the availability of in person appointments. The patient expressed understanding and agreed to proceed.    History of Present Illness: Courtney Cruz is a 52 y.o. who identifies as a female who was assigned female at birth, and is being seen today for possible insect bite on her L gluteal region. Noticed Tuesday sitting in a chair on the porch. After getting up she noted her left gluteus was sore but did not think much of it. Wednesday morning noting swelling and redness of her left buttock and down the leg somewhat with substantial tenderness. Notes two palpable knots on the left gluteus. Denies any fever, chills, malaise.   HPI: HPI  Problems:  Patient Active Problem List   Diagnosis Date Noted   Encounter for screening colonoscopy    Polyp of transverse colon    Cervical mass 10/16/2019   Status post abdominal hysterectomy 10/16/2019   Menorrhagia 09/25/2019   Uterine mass 09/25/2019    Allergies:  Allergies  Allergen Reactions   Sumatriptan Anaphylaxis and Shortness Of Breath   Latex Rash   Medications:  Current Outpatient Medications:    doxycycline (VIBRA-TABS) 100 MG tablet, Take 1 tablet (100 mg total) by mouth 2 (two) times daily., Disp: 14 tablet, Rfl: 0   hydrOXYzine (VISTARIL) 25 MG capsule, Take  1 capsule (25 mg total) by mouth every 8 (eight) hours as needed., Disp: 15 capsule, Rfl: 0   Accu-Chek Softclix Lancets lancets, Test once daily, Disp: 100 each, Rfl: 0   acetaminophen (TYLENOL) 500 MG tablet, Take 2 tablets (1,000 mg total) by mouth every 6 (six) hours as needed for mild pain. (Patient not taking: Reported on 10/07/2021), Disp: 30 tablet, Rfl: 0   ferrous sulfate 325 (65 FE) MG EC tablet, Take 325 mg by mouth every morning.  (Patient  not taking: Reported on 08/19/2021), Disp: , Rfl:    ibuprofen (ADVIL) 600 MG tablet, Take 1 tablet (600 mg total) by mouth every 8 (eight) hours as needed for mild pain. (Patient not taking: Reported on 11/29/2019), Disp: 20 tablet, Rfl: 0   metFORMIN (GLUCOPHAGE) 500 MG tablet, TAKE 1 TABLET BY MOUTH EVERY DAY WITH BREAKFAST, Disp: 90 tablet, Rfl: 1   sertraline (ZOLOFT) 25 MG tablet, Take 1 tablet (25 mg total) by mouth daily., Disp: 90 tablet, Rfl: 1  Observations/Objective: No labored breathing. Speech is clear and coherent with logical content.  Patient is alert and oriented at baseline.   Assessment and Plan: 1. Cellulitis of buttock, left - doxycycline (VIBRA-TABS) 100 MG tablet; Take 1 tablet (100 mg total) by mouth 2 (two) times daily.  Dispense: 14 tablet; Refill: 0 - hydrOXYzine (VISTARIL) 25 MG capsule; Take 1 capsule (25 mg total) by mouth every 8 (eight) hours as needed.  Dispense: 15 capsule; Refill: 0  Supportive measures and OTC medications reviewed. Start Doxycycline as directed. Hydroxyzine to help with local inflammation. Cold compresses recommended. Strict PCP follow-up discussed. ER for any acutely worsening symptoms or fever despite treatment.   Follow Up Instructions: I discussed the assessment and treatment plan with the patient. The patient was provided an opportunity to ask questions and all were answered. The patient agreed with the plan and demonstrated an understanding of the instructions.  A copy of instructions were sent to the patient via MyChart unless otherwise noted below.   The patient was advised to call back or seek an in-person evaluation if the symptoms worsen or if the condition fails to improve as anticipated.  Time:  I spent 10 minutes with the patient via telehealth technology discussing the above problems/concerns.    [name], PA-C

## 2022-08-06 ENCOUNTER — Other Ambulatory Visit: Payer: Self-pay | Admitting: Family Medicine

## 2022-08-07 ENCOUNTER — Other Ambulatory Visit: Payer: Self-pay | Admitting: Family Medicine

## 2022-08-07 DIAGNOSIS — E119 Type 2 diabetes mellitus without complications: Secondary | ICD-10-CM

## 2022-08-08 NOTE — Telephone Encounter (Signed)
Requested medication (s) are due for refill today: routing for review  Requested medication (s) are on the active medication list: no  Last refill:  unknown  Future visit scheduled: yes  Notes to clinic:  Unable to refill per protocol, Rx expired medication is not on current list, routing for approval.      Requested Prescriptions  Pending Prescriptions Disp Refills   ACCU-CHEK GUIDE test strip [Pharmacy Med Name: ACCU-CHEK GUIDE TEST STRIP] 100 strip     Sig: TEST ONCE DAILY     Endocrinology: Diabetes - Testing Supplies Passed - 08/07/2022  8:33 AM      Passed - Valid encounter within last 12 months    Recent Outpatient Visits           3 months ago New onset type 2 diabetes mellitus (HCC)   [facility]   10 months ago Anxiety and depression   [place]   11 months ago Iron deficiency anemia, unspecified iron deficiency anemia type   [facility]       Future Appointments             In 3 days Duanne Limerick, MD [facility] at [facility]

## 2022-08-08 NOTE — Telephone Encounter (Signed)
Requested Prescriptions  Pending Prescriptions Disp Refills   Accu-Chek Softclix Lancets lancets [Pharmacy Med Name: ACCU-CHEK SOFTCLIX LANCETS] 100 each 0    Sig: USE TO TEST ONCE DAILY     Endocrinology: Diabetes - Testing Supplies Passed - 08/06/2022 10:56 AM      Passed - Valid encounter within last 12 months    Recent Outpatient Visits           3 months ago New onset type 2 diabetes mellitus (HCC)   [facility]   10 months ago Anxiety and depression   [facility]   11 months ago Iron deficiency anemia, unspecified iron deficiency anemia type   [facility] at [facility]       Future Appointments             In 3 days Duanne Limerick, MD [facility] at [facility], [facility]

## 2022-08-09 LAB — HM DIABETES EYE EXAM

## 2022-08-11 ENCOUNTER — Ambulatory Visit (INDEPENDENT_AMBULATORY_CARE_PROVIDER_SITE_OTHER): Payer: 59 | Admitting: Family Medicine

## 2022-08-11 VITALS — BP 124/70 | HR 64 | Ht 59.0 in | Wt 187.0 lb

## 2022-08-11 DIAGNOSIS — E119 Type 2 diabetes mellitus without complications: Secondary | ICD-10-CM | POA: Diagnosis not present

## 2022-08-11 DIAGNOSIS — Z7984 Long term (current) use of oral hypoglycemic drugs: Secondary | ICD-10-CM

## 2022-08-11 MED ORDER — METFORMIN HCL 500 MG PO TABS: ORAL_TABLET | ORAL | 1 refills | Status: DC

## 2022-08-11 NOTE — Patient Instructions (Signed)

## 2022-08-11 NOTE — Progress Notes (Signed)
Date:  08/11/2022   Name:  Courtney Cruz   DOB:  [birth year]   MRN:  [id number]   Chief Complaint: Diabetes and skin sensitive  Diabetes She presents for her follow-up diabetic visit. She has type 2 diabetes mellitus. Her disease course has been stable. There are no hypoglycemic associated symptoms. There are no diabetic associated symptoms. Pertinent negatives for diabetes include no chest pain, no polydipsia, no polyphagia and no polyuria. There are no hypoglycemic complications. Symptoms are stable. There are no diabetic complications. Current diabetic treatment includes diet and oral agent (monotherapy). She is compliant with treatment all of the time. She is following a generally healthy diet. Meal planning includes avoidance of concentrated sweets and carbohydrate counting. She participates in exercise intermittently.    Lab Results  Component Value Date   NA 140 10/07/2021   K 4.4 10/07/2021   CO2 23 10/07/2021   GLUCOSE 111 (H) 10/07/2021   BUN 17 10/07/2021   CREATININE 1.05 (H) 10/07/2021   CALCIUM 9.6 10/07/2021   EGFR 64 10/07/2021   GFRNONAA >60 10/18/2019   Lab Results  Component Value Date   CHOL 181 10/07/2021   HDL 65 10/07/2021   LDLCALC 97 10/07/2021   TRIG 105 10/07/2021   No results found for: "TSH" Lab Results  Component Value Date   HGBA1C 6.5 (H) 10/07/2021   Lab Results  Component Value Date   WBC 7.6 05/10/2022   HGB 13.3 05/10/2022   HCT 41.5 05/10/2022   MCV 82 05/10/2022   PLT 299 05/10/2022   Lab Results  Component Value Date   ALT 14 10/14/2019   AST 18 10/14/2019   ALKPHOS 64 10/14/2019   BILITOT 0.5 10/14/2019   No results found for: "25OHVITD2", "25OHVITD3", "VD25OH"   Review of Systems  Constitutional:  Negative for fever and unexpected weight change.  HENT:  Negative for trouble swallowing.   Eyes:  Negative for visual disturbance.  Respiratory:  Negative for chest tightness and shortness of breath.   Cardiovascular:   Negative for chest pain, palpitations and leg swelling.  Gastrointestinal:  Negative for abdominal pain, blood in stool, constipation and diarrhea.  Endocrine: Negative for polydipsia, polyphagia and polyuria.  Genitourinary:  Negative for difficulty urinating.    Patient Active Problem List   Diagnosis Date Noted   Encounter for screening colonoscopy    Polyp of transverse colon    Cervical mass 10/16/2019   Status post abdominal hysterectomy 10/16/2019   Menorrhagia 09/25/2019   Uterine mass 09/25/2019    Allergies  Allergen Reactions   Sumatriptan Anaphylaxis and Shortness Of Breath   Latex Rash    Past Surgical History:  Procedure Laterality Date   CESAREAN SECTION     COLONOSCOPY WITH PROPOFOL N/A 09/09/2021   Procedure: COLONOSCOPY WITH PROPOFOL;  Surgeon: Midge Minium, MD;  Location: [facility];  Service: Endoscopy;  Laterality: N/A;   HYSTERECTOMY ABDOMINAL WITH SALPINGECTOMY Bilateral 10/16/2019   Procedure: HYSTERECTOMY ABDOMINAL WITH SALPINGECTOMY;  Surgeon: Conard Novak, MD;  Location: [facility];  Service: Gynecology;  Laterality: Bilateral;   LAPAROTOMY WITH STAGING N/A 10/16/2019   Procedure: LAPAROTOMY EXPLORATORY;  Surgeon: Conard Novak, MD;  Location: [facility];  Service: Gynecology;  Laterality: N/A;    Social History   Tobacco Use   Smoking status: Former   Smokeless tobacco: Never  Building services engineer Use: Never used  Substance Use Topics   Alcohol use: Yes    Comment: Occ  Drug use: Never     Medication list has been reviewed and updated.  Current Meds  Medication Sig   Accu-Chek Softclix Lancets lancets USE TO TEST ONCE DAILY   glucose blood (ACCU-CHEK GUIDE) test strip TEST ONCE DAILY   hydrOXYzine (VISTARIL) 25 MG capsule Take 1 capsule (25 mg total) by mouth every 8 (eight) hours as needed.   metFORMIN (GLUCOPHAGE) 500 MG tablet TAKE 1 TABLET BY MOUTH EVERY DAY WITH BREAKFAST   sertraline (ZOLOFT) 25 MG tablet Take 1  tablet (25 mg total) by mouth daily.       08/11/2022    2:01 PM 05/10/2022    3:03 PM 10/07/2021    2:35 PM 08/19/2021    2:13 PM  GAD 7 : Generalized Anxiety Score  Nervous, Anxious, on Edge 0 0 0 1  Control/stop worrying 0 0 0 1  Worry too much - different things 0 0 0 1  Trouble relaxing 0 0 0 1  Restless 0 0 0 0  Easily annoyed or irritable 0 0 0 1  Afraid - awful might happen 0 0 0 0  Total GAD 7 Score 0 0 0 5  Anxiety Difficulty Not difficult at all Not difficult at all  Not difficult at all       08/11/2022    2:01 PM 05/10/2022    3:03 PM 10/07/2021    2:34 PM  Depression screen PHQ 2/9  Decreased Interest 0 0 0  Down, Depressed, Hopeless 0 0 0  PHQ - 2 Score 0 0 0  Altered sleeping 0 0 0  Tired, decreased energy 0 0 0  Change in appetite 0 0 0  Feeling bad or failure about yourself  0 0 0  Trouble concentrating 0 0 0  Moving slowly or fidgety/restless 0 0 0  Suicidal thoughts 0 0 0  PHQ-9 Score 0 0 0  Difficult doing work/chores Not difficult at all Not difficult at all Not difficult at all    BP Readings from Last 3 Encounters:  08/11/22 124/70  05/10/22 128/62  03/01/22 (!) 149/84    Physical Exam Vitals and nursing note reviewed. Exam conducted with a chaperone present.  Constitutional:      General: She is not in acute distress.    Appearance: She is not diaphoretic.  HENT:     Head: Normocephalic and atraumatic.     Right Ear: Tympanic membrane, ear canal and external ear normal.     Left Ear: Tympanic membrane, ear canal and external ear normal.     Nose: Nose normal.     Mouth/Throat:     Mouth: Mucous membranes are moist.     Pharynx: No posterior oropharyngeal erythema.  Eyes:     General:        Right eye: No discharge.        Left eye: No discharge.     Conjunctiva/sclera: Conjunctivae normal.     Pupils: Pupils are equal, round, and reactive to light.  Neck:     Thyroid: No thyromegaly.     Vascular: No JVD.  Cardiovascular:      Rate and Rhythm: Normal rate and regular rhythm.     Heart sounds: Normal heart sounds. No murmur heard.    No friction rub. No gallop.  Pulmonary:     Effort: Pulmonary effort is normal.     Breath sounds: Normal breath sounds. No wheezing, rhonchi or rales.  Abdominal:     General: Bowel sounds are  normal.     Palpations: Abdomen is soft. There is no mass.     Tenderness: There is no abdominal tenderness. There is no guarding.  Musculoskeletal:        General: Normal range of motion.     Cervical back: Normal range of motion and neck supple.  Lymphadenopathy:     Cervical: No cervical adenopathy.  Skin:    General: Skin is warm and dry.  Neurological:     Mental Status: She is alert.     Deep Tendon Reflexes: Reflexes are normal and symmetric.     Wt Readings from Last 3 Encounters:  08/11/22 187 lb (84.8 kg)  05/10/22 193 lb (87.5 kg)  10/07/21 194 lb (88 kg)    BP 124/70   Pulse 64   Ht 4\' 11"  (1.499 m)   Wt 187 lb (84.8 kg)   LMP 09/28/2019   SpO2 98%   BMI 37.77 kg/m   Assessment and Plan: 1. Diabetes mellitus treated with oral medication (HCC) Chronic.  Controlled.  Stable.  Patient is mildly diabetic with an A1c just into the diabetic range.  Currently is taking metformin 500 mg once a day we will continue at current dosing and will check A1c for effects.  In addition we will check lipid panel for current level of control and dietary guidelines have been provided.  As well as CMP for electrolytes and hepatotoxicity determination of the liver with ALT AST's. - metFORMIN (GLUCOPHAGE) 500 MG tablet; TAKE 1 TABLET BY MOUTH EVERY DAY WITH BREAKFAST  Dispense: 90 tablet; Refill: 1 - HgB A1c - Lipid Panel With LDL/HDL Ratio - Comprehensive Metabolic Panel (CMET)     Elizabeth Sauer, MD

## 2022-08-12 LAB — COMPREHENSIVE METABOLIC PANEL
ALT: 12 IU/L (ref 0–32)
AST: 17 IU/L (ref 0–40)
Albumin/Globulin Ratio: 1.4
Albumin: 4.5 g/dL (ref 3.8–4.9)
Alkaline Phosphatase: 98 IU/L (ref 44–121)
BUN/Creatinine Ratio: 12 (ref 9–23)
BUN: 13 mg/dL (ref 6–24)
Bilirubin Total: 0.2 mg/dL (ref 0.0–1.2)
CO2: 21 mmol/L (ref 20–29)
Calcium: 8.9 mg/dL (ref 8.7–10.2)
Chloride: 105 mmol/L (ref 96–106)
Creatinine, Ser: 1.09 mg/dL — ABNORMAL HIGH (ref 0.57–1.00)
Globulin, Total: 3.2 g/dL (ref 1.5–4.5)
Glucose: 95 mg/dL (ref 70–99)
Potassium: 4.4 mmol/L (ref 3.5–5.2)
Sodium: 142 mmol/L (ref 134–144)
Total Protein: 7.7 g/dL (ref 6.0–8.5)
eGFR: 61 mL/min/{1.73_m2} (ref >59)

## 2022-08-12 LAB — LIPID PANEL WITH LDL/HDL RATIO
Cholesterol, Total: 179 mg/dL (ref 100–199)
HDL: 70 mg/dL (ref >39)
LDL Chol Calc (NIH): 92 mg/dL (ref 0–99)
LDL/HDL Ratio: 1.3 ratio (ref 0.0–3.2)
Triglycerides: 96 mg/dL (ref 0–149)
VLDL Cholesterol Cal: 17 mg/dL (ref 5–40)

## 2022-08-12 LAB — HEMOGLOBIN A1C
Est. average glucose Bld gHb Est-mCnc: 137 mg/dL
Hgb A1c MFr Bld: 6.4 % — ABNORMAL HIGH (ref 4.8–5.6)

## 2022-09-17 ENCOUNTER — Telehealth: Payer: 59 | Admitting: Nurse Practitioner

## 2022-09-17 DIAGNOSIS — K122 Cellulitis and abscess of mouth: Secondary | ICD-10-CM

## 2022-09-17 MED ORDER — CLINDAMYCIN HCL 300 MG PO CAPS: 300.0000 mg | ORAL_CAPSULE | Freq: Three times a day (TID) | ORAL | 0 refills | Status: DC

## 2022-09-17 MED ORDER — NAPROXEN 500 MG PO TABS: 500.0000 mg | ORAL_TABLET | Freq: Two times a day (BID) | ORAL | 1 refills | Status: DC

## 2022-09-17 NOTE — Progress Notes (Signed)
Virtual Visit Consent   Courtney Cruz, you are scheduled for a virtual visit with Mary-Margaret Daphine Deutscher, FNP, a [facility] provider, today.     Just as with appointments in the office, your consent must be obtained to participate.  Your consent will be active for this visit and any virtual visit you may have with one of our providers in the next 365 days.     If you have a MyChart account, a copy of this consent can be sent to you electronically.  All virtual visits are billed to your insurance company just like a traditional visit in the office.    As this is a virtual visit, video technology does not allow for your provider to perform a traditional examination.  This may limit your provider's ability to fully assess your condition.  If your provider identifies any concerns that need to be evaluated in person or the need to arrange testing (such as labs, EKG, etc.), we will make arrangements to do so.     Although advances in technology are sophisticated, we cannot ensure that it will always work on either your end or our end.  If the connection with a video visit is poor, the visit may have to be switched to a telephone visit.  With either a video or telephone visit, we are not always able to ensure that we have a secure connection.     I need to obtain your verbal consent now.   Are you willing to proceed with your visit today? YES   Courtney Cruz has provided verbal consent on 09/17/2022 for a virtual visit (video or telephone).   Mary-Margaret Daphine Deutscher, FNP   Date: 09/17/2022 2:16 PM   Virtual Visit via Video Note   I, Mary-Margaret Daphine Deutscher, connected with Courtney Cruz ([id number], [birth year]) on 09/17/22 at  2:30 PM EDT by a video-enabled telemedicine application and verified that I am speaking with the correct person using two identifiers.  Location: Patient: Virtual Visit Location Patient: Home Provider: Virtual Visit Location Provider: Mobile   I discussed the limitations  of evaluation and management by telemedicine and the availability of in person appointments. The patient expressed understanding and agreed to proceed.    History of Present Illness: Courtney Cruz is a 52 y.o. who identifies as a female who was assigned female at birth, and is being seen today for facial swelling.  HPI: Facial swelling that started yesterday. Has had some upper right gum discomfort since Thursday. Painful to eat. Has been able to drink. She has taken tylenol which has not helped.     Review of Systems  Constitutional:  Positive for fever (? low grade). Negative for chills and malaise/fatigue.    Problems:  Patient Active Problem List   Diagnosis Date Noted   Encounter for screening colonoscopy    Polyp of transverse colon    Cervical mass 10/16/2019   Status post abdominal hysterectomy 10/16/2019   Menorrhagia 09/25/2019   Uterine mass 09/25/2019    Allergies:  Allergies  Allergen Reactions   Sumatriptan Anaphylaxis and Shortness Of Breath   Latex Rash   Medications:  Current Outpatient Medications:    Accu-Chek Softclix Lancets lancets, USE TO TEST ONCE DAILY, Disp: 100 each, Rfl: 0   acetaminophen (TYLENOL) 500 MG tablet, Take 2 tablets (1,000 mg total) by mouth every 6 (six) hours as needed for mild pain. (Patient not taking: Reported on 10/07/2021), Disp: 30 tablet, Rfl: 0   ferrous sulfate 325 (  65 FE) MG EC tablet, Take 325 mg by mouth every morning.  (Patient not taking: Reported on 08/19/2021), Disp: , Rfl:    glucose blood (ACCU-CHEK GUIDE) test strip, TEST ONCE DAILY, Disp: 100 strip, Rfl: 0   hydrOXYzine (VISTARIL) 25 MG capsule, Take 1 capsule (25 mg total) by mouth every 8 (eight) hours as needed., Disp: 15 capsule, Rfl: 0   ibuprofen (ADVIL) 600 MG tablet, Take 1 tablet (600 mg total) by mouth every 8 (eight) hours as needed for mild pain. (Patient not taking: Reported on 08/11/2022), Disp: 20 tablet, Rfl: 0   metFORMIN (GLUCOPHAGE) 500 MG tablet, TAKE  1 TABLET BY MOUTH EVERY DAY WITH BREAKFAST, Disp: 90 tablet, Rfl: 1   sertraline (ZOLOFT) 25 MG tablet, Take 1 tablet (25 mg total) by mouth daily., Disp: 90 tablet, Rfl: 1  Observations/Objective: Patient is well-developed, well-nourished in no acute distress.  Resting comfortably  at home.  Head is normocephalic, atraumatic.  No labored breathing.  Speech is clear and coherent with logical content.  Patient is alert and oriented at baseline.  Right facial swelling  Assessment and Plan:  Courtney Cruz in today with chief complaint of Facial Swelling   1. Oral abscess Gargle with warm salt water'see dentist as soon as can  Meds ordered this encounter  Medications   clindamycin (CLEOCIN) 300 MG capsule    Sig: Take 1 capsule (300 mg total) by mouth 3 (three) times daily.    Dispense:  30 capsule    Refill:  0    Order Specific Question:   Supervising Provider    Answer:   Merrilee Jansky [[id number]]   naproxen (NAPROSYN) 500 MG tablet    Sig: Take 1 tablet (500 mg total) by mouth 2 (two) times daily with a meal.    Dispense:  60 tablet    Refill:  1    Order Specific Question:   Supervising Provider    Answer:   Merrilee Jansky [[id number]]     Follow Up Instructions: I discussed the assessment and treatment plan with the patient. The patient was provided an opportunity to ask questions and all were answered. The patient agreed with the plan and demonstrated an understanding of the instructions.  A copy of instructions were sent to the patient via MyChart.  The patient was advised to call back or seek an in-person evaluation if the symptoms worsen or if the condition fails to improve as anticipated.  Time:  I spent 8 minutes with the patient via telehealth technology discussing the above problems/concerns.    Mary-Margaret Daphine Deutscher, FNP

## 2022-10-04 ENCOUNTER — Encounter: Payer: Self-pay | Admitting: Family Medicine

## 2022-10-19 ENCOUNTER — Other Ambulatory Visit: Payer: Self-pay | Admitting: Family Medicine

## 2022-10-19 DIAGNOSIS — Z1231 Encounter for screening mammogram for malignant neoplasm of breast: Secondary | ICD-10-CM

## 2022-10-26 ENCOUNTER — Ambulatory Visit
Admission: RE | Admit: 2022-10-26 | Discharge: 2022-10-26 | Disposition: A | Discharge status: Home / Self Care | Payer: 59 | Source: Ambulatory Visit | Attending: Family Medicine | Admitting: Family Medicine

## 2022-10-26 DIAGNOSIS — Z1231 Encounter for screening mammogram for malignant neoplasm of breast: Secondary | ICD-10-CM | POA: Diagnosis not present

## 2022-11-16 ENCOUNTER — Other Ambulatory Visit: Payer: Self-pay | Admitting: Nurse Practitioner

## 2022-12-06 ENCOUNTER — Telehealth: Payer: 59 | Admitting: Physician Assistant

## 2022-12-06 DIAGNOSIS — L0292 Furuncle, unspecified: Secondary | ICD-10-CM | POA: Diagnosis not present

## 2022-12-06 MED ORDER — DOXYCYCLINE HYCLATE 100 MG PO TABS
100.0000 mg | ORAL_TABLET | Freq: Two times a day (BID) | ORAL | 0 refills | Status: DC
Start: 2022-12-06 — End: 2023-02-17

## 2022-12-06 NOTE — Patient Instructions (Signed)
Courtney Cruz, thank you for joining Courtney Climes, PA-C for today's virtual visit.  While this provider is not your primary care provider (PCP), if your PCP is located in our provider database this encounter information will be shared with them immediately following your visit.   A [facility] MyChart account gives you access to today's visit and all your visits, tests, and labs performed at [facility] " click here if you don't have a [facility] MyChart account or go to [email or web address]  Consent: (Patient) Courtney Cruz provided verbal consent for this virtual visit at the beginning of the encounter.  Current Medications:  Current Outpatient Medications:    Accu-Chek Softclix Lancets lancets, USE TO TEST ONCE DAILY, Disp: 100 each, Rfl: 0   acetaminophen (TYLENOL) 500 MG tablet, Take 2 tablets (1,000 mg total) by mouth every 6 (six) hours as needed for mild pain. (Patient not taking: Reported on 10/07/2021), Disp: 30 tablet, Rfl: 0   clindamycin (CLEOCIN) 300 MG capsule, Take 1 capsule (300 mg total) by mouth 3 (three) times daily., Disp: 30 capsule, Rfl: 0   ferrous sulfate 325 (65 FE) MG EC tablet, Take 325 mg by mouth every morning.  (Patient not taking: Reported on 08/19/2021), Disp: , Rfl:    glucose blood (ACCU-CHEK GUIDE) test strip, TEST ONCE DAILY, Disp: 100 strip, Rfl: 0   hydrOXYzine (VISTARIL) 25 MG capsule, Take 1 capsule (25 mg total) by mouth every 8 (eight) hours as needed., Disp: 15 capsule, Rfl: 0   ibuprofen (ADVIL) 600 MG tablet, Take 1 tablet (600 mg total) by mouth every 8 (eight) hours as needed for mild pain. (Patient not taking: Reported on 08/11/2022), Disp: 20 tablet, Rfl: 0   metFORMIN (GLUCOPHAGE) 500 MG tablet, TAKE 1 TABLET BY MOUTH EVERY DAY WITH BREAKFAST, Disp: 90 tablet, Rfl: 1   naproxen (NAPROSYN) 500 MG tablet, Take 1 tablet (500 mg total) by mouth 2 (two) times daily with a meal., Disp: 60 tablet, Rfl: 1   sertraline (ZOLOFT)  25 MG tablet, Take 1 tablet (25 mg total) by mouth daily., Disp: 90 tablet, Rfl: 1   Medications ordered in this encounter:  No orders of the defined types were placed in this encounter.    *If you need refills on other medications prior to your next appointment, please contact your pharmacy*  Follow-Up: Call back or seek an in-person evaluation if the symptoms worsen or if the condition fails to improve as anticipated.  [facility] [phone]  Other Instructions Skin Abscess  A skin abscess is an infected spot of skin. It can have pus in it. An abscess can happen in any part of your body. Some abscesses break open (rupture) on their own. Most keep getting worse unless they are treated. If your abscess is not treated, the infection can spread deeper into your body and blood. This can make you feel sick. What are the causes? Germs that enter your skin. This may happen if you have: A cut or scrape. A wound from a needle or an insect bite. Blocked oil or sweat glands. A problem with the spot where your hair goes into your skin. A fluid-filled sac called a cyst under your skin. What increases the risk? Having problems with how your blood moves through your body. Having a weak body defense system (immune system). Having diabetes. Having dry and irritated skin. Needing to get shots often. Putting drugs into your body with a needle. Having a splinter or  something else in your skin. Smoking. What are the signs or symptoms? A firm bump under your skin that hurts. A bump with pus at the top. Redness and swelling. Warm or tender spots. A sore on the skin. How is this treated? You may need to: Put a heat pack or a warm, wet washcloth on the spot. Have the pus drained. Take antibiotics. Follow these instructions at home: Medicines Take over-the-counter and prescription medicines only as told by your doctor. If you were prescribed antibiotics, take them as told by  your doctor. Do not stop taking them even if you start to feel better. Abscess care  If you have an abscess that has not drained, put heat on it. Use the heat source that your doctor recommends, such as a moist heat pack or a heating pad. Place a towel between your skin and the heat source. Leave the heat on for 20-30 minutes. If your skin turns bright red, take off the heat right away to prevent burns. The risk of burns is higher if you cannot feel pain, heat, or cold. Follow instructions from your doctor about how to take care of your abscess. Make sure you: Cover the abscess with a bandage. Wash your hands with soap and water for at least 20 seconds before and after you change your bandage. If you cannot use soap and water, use hand sanitizer. Change your bandage as told by your doctor. Check your abscess every day for signs that the infection is getting worse. Check for: More redness, swelling, or pain. More fluid or blood. Warmth. More pus or a worse smell. General instructions To keep the infection from spreading: Do not share personal items or towels. Do not go in a hot tub with others. Avoid making skin contact with others. Be careful when you get rid of used bandages or any pus from the abscess. Do not smoke or use any products that contain nicotine or tobacco. If you need help quitting, ask your doctor. Contact a doctor if: You see red streaks on your skin near the abscess. You have any signs of worse infection. You vomit every time you eat or drink. You have a fever, chills, or muscle aches. The cyst or abscess comes back. Get help right away if: You have very bad pain. You make less pee (urine) than normal. This information is not intended to replace advice given to you by your health care provider. Make sure you discuss any questions you have with your health care provider. Document Revised: 09/29/2021 Document Reviewed: 09/29/2021 Elsevier Patient Education  2024  Elsevier Inc.    If you have been instructed to have an in-person evaluation today at a local Urgent Care facility, please use the link below. It will take you to a list of all of our available [facility], including address, phone number and hours of operation. Please do not delay care.  [facility]  If you or a family member do not have a primary care provider, use the link below to schedule a visit and establish care. When you choose a [facility] primary care physician or advanced practice provider, you gain a long-term partner in health. Find a Primary Care Provider  Learn more about [facility]'s in-office and virtual care options: [facility] - Get Care Now

## 2022-12-06 NOTE — Progress Notes (Signed)
Virtual Visit Consent   Courtney Cruz, you are scheduled for a virtual visit with a [facility] provider today. Just as with appointments in the office, your consent must be obtained to participate. Your consent will be active for this visit and any virtual visit you may have with one of our providers in the next 365 days. If you have a MyChart account, a copy of this consent can be sent to you electronically.  As this is a virtual visit, video technology does not allow for your provider to perform a traditional examination. This may limit your provider's ability to fully assess your condition. If your provider identifies any concerns that need to be evaluated in person or the need to arrange testing (such as labs, EKG, etc.), we will make arrangements to do so. Although advances in technology are sophisticated, we cannot ensure that it will always work on either your end or our end. If the connection with a video visit is poor, the visit may have to be switched to a telephone visit. With either a video or telephone visit, we are not always able to ensure that we have a secure connection.  By engaging in this virtual visit, you consent to the provision of healthcare and authorize for your insurance to be billed (if applicable) for the services provided during this visit. Depending on your insurance coverage, you may receive a charge related to this service.  I need to obtain your verbal consent now. Are you willing to proceed with your visit today? Courtney Cruz has provided verbal consent on 12/06/2022 for a virtual visit (video or telephone). [name], [place]  Date: 12/06/2022 3:26 PM  Virtual Visit via Video Note   I, [name], connected with  Courtney Cruz  ([id number], 1970-09-06) on 12/06/22 at  3:30 PM EDT by a video-enabled telemedicine application and verified that I am speaking with the correct person using two identifiers.  Location: Patient: Virtual Visit Location  Patient: Home Provider: Virtual Visit Location Provider: Home Office   I discussed the limitations of evaluation and management by telemedicine and the availability of in person appointments. The patient expressed understanding and agreed to proceed.    History of Present Illness: Courtney Cruz is a 52 y.o. who identifies as a female who was assigned female at birth, and is being seen today for boil of R axillary region first noted 2 days ago. Continues to increase in size and tenderness. Painful to move her shoulder or clothes to touch the area. Denies change in soaps, lotions or detergents. Notes feels firmer to her. Denies fevers, chills. Denies drainage that she has noted.   HPI: HPI  Problems:  Patient Active Problem List   Diagnosis Date Noted   Encounter for screening colonoscopy    Polyp of transverse colon    Cervical mass 10/16/2019   Status post abdominal hysterectomy 10/16/2019   Menorrhagia 09/25/2019   Uterine mass 09/25/2019    Allergies:  Allergies  Allergen Reactions   Sumatriptan Anaphylaxis and Shortness Of Breath   Latex Rash   Medications:  Current Outpatient Medications:    doxycycline (VIBRA-TABS) 100 MG tablet, Take 1 tablet (100 mg total) by mouth 2 (two) times daily., Disp: 14 tablet, Rfl: 0   Accu-Chek Softclix Lancets lancets, USE TO TEST ONCE DAILY, Disp: 100 each, Rfl: 0   ferrous sulfate 325 (65 FE) MG EC tablet, Take 325 mg by mouth every morning.  (Patient not taking: Reported on  08/19/2021), Disp: , Rfl:    glucose blood (ACCU-CHEK GUIDE) test strip, TEST ONCE DAILY, Disp: 100 strip, Rfl: 0   metFORMIN (GLUCOPHAGE) 500 MG tablet, TAKE 1 TABLET BY MOUTH EVERY DAY WITH BREAKFAST, Disp: 90 tablet, Rfl: 1   naproxen (NAPROSYN) 500 MG tablet, Take 1 tablet (500 mg total) by mouth 2 (two) times daily with a meal., Disp: 60 tablet, Rfl: 1   sertraline (ZOLOFT) 25 MG tablet, Take 1 tablet (25 mg total) by mouth daily., Disp: 90 tablet, Rfl:  1  Observations/Objective: Patient is well-developed, well-nourished in no acute distress.  Resting comfortably at home.  Head is normocephalic, atraumatic.  No labored breathing. Speech is clear and coherent with logical content.  Patient is alert and oriented at baseline.  R Axillary region observed. In the lateral portion of this a 3x3 cm (estimated) area of SQ focal swelling noted with substantial erythema. No active drainage noted.   Assessment and Plan: 1. Boil - doxycycline (VIBRA-TABS) 100 MG tablet; Take 1 tablet (100 mg total) by mouth 2 (two) times daily.  Dispense: 14 tablet; Refill: 0  Supportive measures and OTC medications reviewed (analgesics). Start warm compresses. Doxycycline per orders. Strict in-person follow-up precautions reviewed with patient.   Follow Up Instructions: I discussed the assessment and treatment plan with the patient. The patient was provided an opportunity to ask questions and all were answered. The patient agreed with the plan and demonstrated an understanding of the instructions.  A copy of instructions were sent to the patient via MyChart unless otherwise noted below.   The patient was advised to call back or seek an in-person evaluation if the symptoms worsen or if the condition fails to improve as anticipated.    [name], PA-C

## 2022-12-11 ENCOUNTER — Other Ambulatory Visit: Payer: Self-pay | Admitting: Family Medicine

## 2022-12-11 DIAGNOSIS — F32A Depression, unspecified: Secondary | ICD-10-CM

## 2022-12-11 NOTE — Telephone Encounter (Signed)
Need med refill appt next 3 mos

## 2023-01-24 ENCOUNTER — Ambulatory Visit: Payer: 59 | Admitting: Family Medicine

## 2023-01-28 ENCOUNTER — Other Ambulatory Visit: Payer: Self-pay | Admitting: Nurse Practitioner

## 2023-02-17 ENCOUNTER — Ambulatory Visit
Admission: RE | Admit: 2023-02-17 | Discharge: 2023-02-17 | Disposition: A | Discharge status: Home / Self Care | Payer: 59 | Attending: Family Medicine | Admitting: Family Medicine

## 2023-02-17 ENCOUNTER — Encounter: Payer: Self-pay | Admitting: Family Medicine

## 2023-02-17 ENCOUNTER — Ambulatory Visit (INDEPENDENT_AMBULATORY_CARE_PROVIDER_SITE_OTHER)
Admission: RE | Admit: 2023-02-17 | Discharge: 2023-02-17 | Disposition: A | Discharge status: Home / Self Care | Payer: 59 | Source: Ambulatory Visit | Attending: Family Medicine | Admitting: Family Medicine

## 2023-02-17 ENCOUNTER — Ambulatory Visit (INDEPENDENT_AMBULATORY_CARE_PROVIDER_SITE_OTHER): Payer: 59 | Admitting: Family Medicine

## 2023-02-17 VITALS — BP 128/74 | HR 80 | Ht 59.0 in

## 2023-02-17 DIAGNOSIS — F419 Anxiety disorder, unspecified: Secondary | ICD-10-CM

## 2023-02-17 DIAGNOSIS — Z7984 Long term (current) use of oral hypoglycemic drugs: Secondary | ICD-10-CM | POA: Diagnosis not present

## 2023-02-17 DIAGNOSIS — M25512 Pain in left shoulder: Secondary | ICD-10-CM

## 2023-02-17 DIAGNOSIS — F32A Depression, unspecified: Secondary | ICD-10-CM | POA: Diagnosis not present

## 2023-02-17 DIAGNOSIS — E119 Type 2 diabetes mellitus without complications: Secondary | ICD-10-CM | POA: Diagnosis not present

## 2023-02-17 DIAGNOSIS — Z0189 Encounter for other specified special examinations: Secondary | ICD-10-CM | POA: Diagnosis not present

## 2023-02-17 MED ORDER — METFORMIN HCL 500 MG PO TABS
ORAL_TABLET | ORAL | 1 refills | Status: DC
Start: 2023-02-17 — End: 2023-10-26

## 2023-02-17 MED ORDER — SERTRALINE HCL 25 MG PO TABS
25.0000 mg | ORAL_TABLET | Freq: Every day | ORAL | 1 refills | Status: DC
Start: 2023-02-17 — End: 2023-10-26

## 2023-02-17 MED ORDER — MELOXICAM 15 MG PO TABS: 15.0000 mg | ORAL_TABLET | Freq: Every day | ORAL | 0 refills | Status: DC

## 2023-02-17 NOTE — Progress Notes (Unsigned)
Date:  02/17/2023   Name:  Courtney Cruz   DOB:  04/24/1970   MRN:  [id number]   Chief Complaint: Diabetes (Pt in clinic today c/o diabetes follow up)  Diabetes She presents for her follow-up diabetic visit. She has type 2 diabetes mellitus. Her disease course has been stable. Hypoglycemia symptoms include nervousness/anxiousness. Pertinent negatives for hypoglycemia include no dizziness or headaches. Associated symptoms include fatigue. Pertinent negatives for diabetes include no chest pain, no polydipsia and no polyuria. There are no hypoglycemic complications. Symptoms are stable. There are no diabetic complications. Risk factors for coronary artery disease include diabetes mellitus. When asked about current treatments, none were reported. Her weight is stable. She is following a generally healthy diet.  Anxiety Presents for follow-up visit. Symptoms include excessive worry, irritability, nervous/anxious behavior and restlessness. Patient reports no chest pain, dizziness, nausea, palpitations, shortness of breath or suicidal ideas. Symptoms occur occasionally.    Shoulder Pain  The pain is present in the left shoulder. This is a new problem. The current episode started more than 1 month ago (september). The problem occurs constantly. The problem has been gradually worsening. The pain is at a severity of 8/10. The pain is moderate. Pertinent negatives include no fever. The symptoms are aggravated by activity (abduction). She has tried heat, cold, acetaminophen and oral narcotics for the symptoms. The treatment provided moderate relief.  Depression        This is a chronic problem.  Associated symptoms include fatigue, irritable, restlessness, decreased interest and sad.  Associated symptoms include no myalgias, no headaches and no suicidal ideas.  Past medical history includes anxiety.     Lab Results  Component Value Date   NA 142 08/11/2022   K 4.4 08/11/2022   CO2 21 08/11/2022    GLUCOSE 95 08/11/2022   BUN 13 08/11/2022   CREATININE 1.09 (H) 08/11/2022   CALCIUM 8.9 08/11/2022   EGFR 61 08/11/2022   GFRNONAA >60 10/18/2019   Lab Results  Component Value Date   CHOL 179 08/11/2022   HDL 70 08/11/2022   LDLCALC 92 08/11/2022   TRIG 96 08/11/2022   No results found for: "TSH" Lab Results  Component Value Date   HGBA1C 6.4 (H) 08/11/2022   Lab Results  Component Value Date   WBC 7.6 05/10/2022   HGB 13.3 05/10/2022   HCT 41.5 05/10/2022   MCV 82 05/10/2022   PLT 299 05/10/2022   Lab Results  Component Value Date   ALT 12 08/11/2022   AST 17 08/11/2022   ALKPHOS 98 08/11/2022   BILITOT 0.2 08/11/2022   No results found for: "25OHVITD2", "25OHVITD3", "VD25OH"   Review of Systems  Constitutional:  Positive for fatigue and irritability. Negative for chills and fever.  HENT:  Negative for drooling, ear discharge, ear pain and sore throat.   Respiratory:  Negative for cough, shortness of breath and wheezing.   Cardiovascular:  Negative for chest pain, palpitations and leg swelling.  Gastrointestinal:  Negative for abdominal pain, blood in stool, constipation, diarrhea and nausea.  Endocrine: Negative for polydipsia and polyuria.  Genitourinary:  Negative for dysuria, frequency, hematuria and urgency.  Musculoskeletal:  Negative for back pain, myalgias and neck pain.  Skin:  Negative for rash.  Allergic/Immunologic: Negative for environmental allergies.  Neurological:  Negative for dizziness and headaches.  Hematological:  Does not bruise/bleed easily.  Psychiatric/Behavioral:  Positive for depression. Negative for suicidal ideas. The patient is nervous/anxious.     Patient Active  Problem List   Diagnosis Date Noted   Encounter for screening colonoscopy    Polyp of transverse colon    Cervical mass 10/16/2019   Status post abdominal hysterectomy 10/16/2019   Menorrhagia 09/25/2019   Uterine mass 09/25/2019    Allergies  Allergen Reactions    Sumatriptan Anaphylaxis and Shortness Of Breath   Latex Rash    Past Surgical History:  Procedure Laterality Date   CESAREAN SECTION     COLONOSCOPY WITH PROPOFOL N/A 09/09/2021   Procedure: COLONOSCOPY WITH PROPOFOL;  Surgeon: Midge Minium, MD;  Location: [facility];  Service: Endoscopy;  Laterality: N/A;   HYSTERECTOMY ABDOMINAL WITH SALPINGECTOMY Bilateral 10/16/2019   Procedure: HYSTERECTOMY ABDOMINAL WITH SALPINGECTOMY;  Surgeon: Conard Novak, MD;  Location: [facility];  Service: Gynecology;  Laterality: Bilateral;   LAPAROTOMY WITH STAGING N/A 10/16/2019   Procedure: LAPAROTOMY EXPLORATORY;  Surgeon: Conard Novak, MD;  Location: [facility];  Service: Gynecology;  Laterality: N/A;    Social History   Tobacco Use   Smoking status: Former   Smokeless tobacco: Never  Vaping Use   Vaping status: Never Used  Substance Use Topics   Alcohol use: Yes    Comment: Occ   Drug use: Never     Medication list has been reviewed and updated.  Current Meds  Medication Sig   Accu-Chek Softclix Lancets lancets USE TO TEST ONCE DAILY   glucose blood (ACCU-CHEK GUIDE) test strip TEST ONCE DAILY   metFORMIN (GLUCOPHAGE) 500 MG tablet TAKE 1 TABLET BY MOUTH EVERY DAY WITH BREAKFAST       02/17/2023    2:57 PM 08/11/2022    2:01 PM 05/10/2022    3:03 PM 10/07/2021    2:35 PM  GAD 7 : Generalized Anxiety Score  Nervous, Anxious, on Edge 1 0 0 0  Control/stop worrying 1 0 0 0  Worry too much - different things 2 0 0 0  Trouble relaxing 2 0 0 0  Restless 0 0 0 0  Easily annoyed or irritable 2 0 0 0  Afraid - awful might happen 0 0 0 0  Total GAD 7 Score 8 0 0 0  Anxiety Difficulty Somewhat difficult Not difficult at all Not difficult at all        02/17/2023    2:56 PM 08/11/2022    2:01 PM 05/10/2022    3:03 PM  Depression screen PHQ 2/9  Decreased Interest 2 0 0  Down, Depressed, Hopeless 2 0 0  PHQ - 2 Score 4 0 0  Altered sleeping 1 0 0  Tired, decreased  energy 2 0 0  Change in appetite 1 0 0  Feeling bad or failure about yourself  0 0 0  Trouble concentrating 1 0 0  Moving slowly or fidgety/restless 0 0 0  Suicidal thoughts 0 0 0  PHQ-9 Score 9 0 0  Difficult doing work/chores Somewhat difficult Not difficult at all Not difficult at all    BP Readings from Last 3 Encounters:  02/17/23 128/74  08/11/22 124/70  05/10/22 128/62    Physical Exam Vitals and nursing note reviewed.  Constitutional:      General: She is irritable.     Appearance: She is well-developed.  HENT:     Head: Normocephalic.     Right Ear: Tympanic membrane and external ear normal.     Left Ear: Tympanic membrane and external ear normal.     Nose: Nose normal.     Mouth/Throat:  Mouth: Mucous membranes are moist.  Eyes:     General: Lids are everted, no foreign bodies appreciated. No scleral icterus.       Left eye: No foreign body or hordeolum.     Conjunctiva/sclera: Conjunctivae normal.     Right eye: Right conjunctiva is not injected.     Left eye: Left conjunctiva is not injected.     Pupils: Pupils are equal, round, and reactive to light.  Neck:     Thyroid: No thyromegaly.     Vascular: No JVD.     Trachea: No tracheal deviation.  Cardiovascular:     Rate and Rhythm: Normal rate and regular rhythm.     Heart sounds: Normal heart sounds. No murmur heard.    No friction rub. No gallop.  Pulmonary:     Effort: Pulmonary effort is normal. No respiratory distress.     Breath sounds: Normal breath sounds. No wheezing or rales.  Abdominal:     General: Bowel sounds are normal.     Palpations: Abdomen is soft. There is no mass.     Tenderness: There is no abdominal tenderness. There is no guarding or rebound.  Musculoskeletal:     Left shoulder: Tenderness present. No swelling. Decreased range of motion.     Cervical back: Normal range of motion and neck supple.     Comments: Tender deltoid vs supraspiatis  Lymphadenopathy:     Cervical: No  cervical adenopathy.  Skin:    General: Skin is warm.     Findings: No rash.  Neurological:     Mental Status: She is alert and oriented to person, place, and time.     Cranial Nerves: No cranial nerve deficit.     Coordination: Coordination normal.     Gait: Gait normal.     Deep Tendon Reflexes: Reflexes normal.  Psychiatric:        Mood and Affect: Mood is not anxious or depressed.     Wt Readings from Last 3 Encounters:  08/11/22 187 lb (84.8 kg)  05/10/22 193 lb (87.5 kg)  10/07/21 194 lb (88 kg)    BP 128/74   Pulse 80   Ht 4\' 11"  (1.499 m)   LMP 09/28/2019   SpO2 97%   BMI 37.77 kg/m   Assessment and Plan: 1. Diabetes mellitus treated with oral medication (HCC) (Primary) Chronic.  Controlled.  Stable.  Asymptomatic.  Without polyuria without polydipsia.  Will continue metformin 500 mg once a day and will check A1c for current level of control as well as renal function panel for electrolytes and GFR.  Will recheck patient in 4 months. - metFORMIN (GLUCOPHAGE) 500 MG tablet; TAKE 1 TABLET BY MOUTH EVERY DAY WITH BREAKFAST  Dispense: 90 tablet; Refill: 1 - Hemoglobin A1c - Renal Function Panel  2. Anxiety and depression Chronic.  Controlled.  Stable.  PHQ is 9.  GAD score is 8.  Patient is tolerating sertraline 25 mg will continue at current dosing of 25 mg and will recheck patient in 4 months and I will recheck consider if we need to go to 50 mg. - sertraline (ZOLOFT) 25 MG tablet; Take 1 tablet (25 mg total) by mouth daily.  Dispense: 90 tablet; Refill: 1  3. Acute pain of left shoulder New onset.  Onset in September there is no injury associated with the shoulder pain pain is overall on the lateral aspect of the left shoulder involves the biceps the deltoid and the supraspinatus.  There is  limited range of motion with inability to abduct.  We will obtain an x-ray and refer to Dr. Ashley Royalty for evaluation and treatment patient will initiate meloxicam 15 mg once a day. -  DG Shoulder Left     Elizabeth Sauer, MD

## 2023-02-18 ENCOUNTER — Encounter: Payer: Self-pay | Admitting: Family Medicine

## 2023-02-18 LAB — RENAL FUNCTION PANEL
Albumin: 4.5 g/dL (ref 3.8–4.9)
BUN/Creatinine Ratio: 18 (ref 9–23)
BUN: 15 mg/dL (ref 6–24)
CO2: 23 mmol/L (ref 20–29)
Calcium: 9.7 mg/dL (ref 8.7–10.2)
Chloride: 108 mmol/L — ABNORMAL HIGH (ref 96–106)
Creatinine, Ser: 0.82 mg/dL (ref 0.57–1.00)
Glucose: 90 mg/dL (ref 70–99)
Phosphorus: 4.2 mg/dL (ref 3.0–4.3)
Potassium: 4.3 mmol/L (ref 3.5–5.2)
Sodium: 144 mmol/L (ref 134–144)
eGFR: 85 mL/min/{1.73_m2} (ref >59)

## 2023-02-18 LAB — HEMOGLOBIN A1C
Est. average glucose Bld gHb Est-mCnc: 137 mg/dL
Hgb A1c MFr Bld: 6.4 % — ABNORMAL HIGH (ref 4.8–5.6)

## 2023-03-06 ENCOUNTER — Encounter: Payer: Self-pay | Admitting: Family Medicine

## 2023-03-06 ENCOUNTER — Ambulatory Visit (INDEPENDENT_AMBULATORY_CARE_PROVIDER_SITE_OTHER): Payer: 59 | Admitting: Family Medicine

## 2023-03-06 VITALS — BP 130/90 | HR 73 | Ht 59.0 in | Wt 200.2 lb

## 2023-03-06 DIAGNOSIS — M7542 Impingement syndrome of left shoulder: Secondary | ICD-10-CM | POA: Diagnosis not present

## 2023-03-06 MED ORDER — DICLOFENAC SODIUM 50 MG PO TBEC: 50.0000 mg | DELAYED_RELEASE_TABLET | Freq: Two times a day (BID) | ORAL | 0 refills | Status: DC | PRN

## 2023-03-06 MED ORDER — TRAMADOL HCL 50 MG PO TABS: 50.0000 mg | ORAL_TABLET | Freq: Three times a day (TID) | ORAL | 0 refills | Status: AC | PRN

## 2023-03-06 NOTE — Progress Notes (Signed)
 Primary Care / Sports Medicine Office Visit  Patient Information:  Patient ID: Courtney Cruz, female DOB: 11-03-1970 Age: 53 y.o. MRN: [id number]   Courtney Cruz is a pleasant 53 y.o. female presenting with the following:  Chief Complaint  Patient presents with   Shoulder Pain    Left shoulder pain x 4 months, Constant ache, sharpe pain with certain movements like reaching. Patient reports popping from her left shoulder. 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 She states the pan goes all the way down her arm. She takes meloxicam  for the pain and tylenol . She has had xrays. NKI and she has not tried any conservative care.     Vitals:   03/06/23 1013  BP: (!) 130/90  Pulse: 73  SpO2: 98%   Vitals:   03/06/23 1013  Weight: 200 lb 3.2 oz (90.8 kg)  Height: 4' 11 (1.499 m)   Body mass index is 40.44 kg/m.  DG Shoulder Left Result Date: 02/17/2023 CLINICAL DATA:  left shoulder/no injury EXAM: LEFT SHOULDER - 2+ VIEW COMPARISON:  None Available. FINDINGS: No acute fracture or dislocation. Joint spaces and alignment are maintained. No area of erosion or osseous destruction. No unexpected radiopaque foreign body. Query underlying cardiomegaly, incompletely visualized. IMPRESSION: 1. No acute fracture or dislocation. 2. Query underlying cardiomegaly, incompletely visualized. Electronically Signed   By: Corean Salter M.D.   On: 02/17/2023 16:15     Independent interpretation of notes and tests performed by another provider:   See below.  Procedures performed:   None  Pertinent History, Exam, Impression, and Recommendations:   Problem List Items Addressed This Visit       Musculoskeletal and Integument   Rotator cuff impingement syndrome of left shoulder - Primary   History of Present Illness The RHD patient, who has been working in childcare for 38 years, presents with severe left atraumatic shoulder pain that started in  September. The pain was initially described as a popping or shifting sensation in the shoulder, but has progressively worsened over time. The pain now radiates down the arm, though not past the elbow. The patient also experiences nocturnal pain, which disrupts sleep. The patient has a high tolerance for pain, but describes the current pain as severe and sometimes debilitating. Over-the-counter pain medication and a prescription for meloxicam  have provided only minimal relief. The patient also reports numbness in the entire hand and some neck pain. The patient has been advised to limit the use of the affected arm, but this is challenging due to the nature of her work.  Physical Exam MUSCULOSKELETAL: Right forward flexion full range of motion, no pain. Left forward flexion limited to 100 -110 degrees, pain limiting range of motion. Right abduction full range of motion, no pain. Left abduction limited to 45-50 degrees, pain limiting range of motion. External rotation limited by pain compared to contralateral side. Internal rotation / extension significantly limited compared to contralateral side, pain with movement. 5/5 strength in external rotation and internal rotation, however, elicits pain on the left. Isolated supraspinatus strength testing showed no weakness, painful on left. Subacromial tenderness present on left side. Non-tender bicipital groove. Mild tenderness left upper trapezius. Positive Neer's sign on left shoulder. Positive Vonzell' test on left shoulder. Positive Mier's test on left. Positive Speed's test on left. Negative Yergason's test on left shoulder.  Results LABS Kidney function: Normal Diabetes control: Stable A1c, improved from August  RADIOLOGY Shoulder X-ray: Glenohumeral  joint appears normal, no significant degeneration, subacromial osseous cortical roughening, mild degeneration at the glenohumeral joint  Assessment and Plan Left RC impingement - focality to subscapularis and  supraspinatus Chronic pain since September with limited range of motion, nocturnal pain, and referred pain down to the elbow. X-ray shows signs concerning for chronic subacromial impingement. No significant arthritis or fracture noted. -Discontinue Meloxicam . -Start Diclofenac  twice daily. -Start Tramadol  as needed for breakthrough pain. -Plan for close follow-up and low threshold for cortisone injection on Friday. -Plan for home exercises once symptoms allow.        Orders & Medications Medications:  Meds ordered this encounter  Medications   diclofenac  (VOLTAREN ) 50 MG EC tablet    Sig: Take 1 tablet (50 mg total) by mouth 2 (two) times daily as needed.    Dispense:  60 tablet    Refill:  0   traMADol  (ULTRAM ) 50 MG tablet    Sig: Take 1 tablet (50 mg total) by mouth every 8 (eight) hours as needed for up to 5 days.    Dispense:  15 tablet    Refill:  0   No orders of the defined types were placed in this encounter.    No follow-ups on file.     Selinda JINNY Ku, MD, [facility]   Primary Care Sports Medicine Primary Care and Sports Medicine at [facility]

## 2023-03-06 NOTE — Patient Instructions (Signed)
  Medication: - Discontinue Meloxicam  and start you on Diclofenac  twice daily for pain management. - You may take Tramadol  as needed for severe pain that does not respond to diclofenac , heat, ice, rest, and Tylenol . - Return for close follow-up on Friday.

## 2023-03-06 NOTE — Assessment & Plan Note (Signed)
 History of Present Illness The RHD patient, who has been working in childcare for 38 years, presents with severe left atraumatic shoulder pain that started in September. The pain was initially described as a popping or shifting sensation in the shoulder, but has progressively worsened over time. The pain now radiates down the arm, though not past the elbow. The patient also experiences nocturnal pain, which disrupts sleep. The patient has a high tolerance for pain, but describes the current pain as severe and sometimes debilitating. Over-the-counter pain medication and a prescription for meloxicam  have provided only minimal relief. The patient also reports numbness in the entire hand and some neck pain. The patient has been advised to limit the use of the affected arm, but this is challenging due to the nature of her work.  Physical Exam MUSCULOSKELETAL: Right forward flexion full range of motion, no pain. Left forward flexion limited to 100 -110 degrees, pain limiting range of motion. Right abduction full range of motion, no pain. Left abduction limited to 45-50 degrees, pain limiting range of motion. External rotation limited by pain compared to contralateral side. Internal rotation / extension significantly limited compared to contralateral side, pain with movement. 5/5 strength in external rotation and internal rotation, however, elicits pain on the left. Isolated supraspinatus strength testing showed no weakness, painful on left. Subacromial tenderness present on left side. Non-tender bicipital groove. Mild tenderness left upper trapezius. Positive Neer's sign on left shoulder. Positive Vonzell' test on left shoulder. Positive Mier's test on left. Positive Speed's test on left. Negative Yergason's test on left shoulder.  Results LABS Kidney function: Normal Diabetes control: Stable A1c, improved from August  RADIOLOGY Shoulder X-ray: Glenohumeral joint appears normal, no significant degeneration,  subacromial osseous cortical roughening, mild degeneration at the glenohumeral joint  Assessment and Plan Left RC impingement - focality to subscapularis and supraspinatus Chronic pain since September with limited range of motion, nocturnal pain, and referred pain down to the elbow. X-ray shows signs concerning for chronic subacromial impingement. No significant arthritis or fracture noted. -Discontinue Meloxicam . -Start Diclofenac  twice daily. -Start Tramadol  as needed for breakthrough pain. -Plan for close follow-up and low threshold for cortisone injection on Friday. -Plan for home exercises once symptoms allow.

## 2023-03-10 ENCOUNTER — Ambulatory Visit (INDEPENDENT_AMBULATORY_CARE_PROVIDER_SITE_OTHER): Payer: 59 | Admitting: Family Medicine

## 2023-03-10 ENCOUNTER — Encounter: Payer: Self-pay | Admitting: Family Medicine

## 2023-03-10 VITALS — BP 128/80 | HR 85 | Ht 59.0 in | Wt 200.0 lb

## 2023-03-10 DIAGNOSIS — M7542 Impingement syndrome of left shoulder: Secondary | ICD-10-CM | POA: Diagnosis not present

## 2023-03-10 DIAGNOSIS — M67922 Unspecified disorder of synovium and tendon, left upper arm: Secondary | ICD-10-CM

## 2023-03-10 MED ORDER — TRIAMCINOLONE ACETONIDE 40 MG/ML IJ SUSP
40.0000 mg | Freq: Once | INTRAMUSCULAR | Status: AC
Start: 2023-03-10 — End: 2023-03-10
  Administered 2023-03-10: 80 mg via INTRAMUSCULAR

## 2023-03-10 NOTE — Progress Notes (Signed)
 Primary Care / Sports Medicine Office Visit  Patient Information:  Patient ID: Courtney Cruz, female DOB: Jul 02, 1970 Age: 53 y.o. MRN: [id number]   Courtney Cruz is a pleasant 53 y.o. female presenting with the following:  Chief Complaint  Patient presents with   Procedure    Vitals:   03/10/23 1354  BP: 128/80  Pulse: 85  SpO2: 99%   Vitals:   03/10/23 1354  Weight: 200 lb (90.7 kg)  Height: 4' 11 (1.499 m)   Body mass index is 40.4 kg/m.  DG Shoulder Left Result Date: 02/17/2023 CLINICAL DATA:  left shoulder/no injury EXAM: LEFT SHOULDER - 2+ VIEW COMPARISON:  None Available. FINDINGS: No acute fracture or dislocation. Joint spaces and alignment are maintained. No area of erosion or osseous destruction. No unexpected radiopaque foreign body. Query underlying cardiomegaly, incompletely visualized. IMPRESSION: 1. No acute fracture or dislocation. 2. Query underlying cardiomegaly, incompletely visualized. Electronically Signed   By: Corean Salter M.D.   On: 02/17/2023 16:15     Independent interpretation of notes and tests performed by another provider:   None  Procedures performed:   Procedure:  Injection of left shoulder subacromial space under ultrasound guidance. Ultrasound guidance utilized for in plane approach, no tendinopathy visualized Samsung HS60 device utilized with permanent recording / reporting. Verbal informed consent obtained and verified. Skin prepped in a sterile fashion. Ethyl chloride for topical local analgesia.  Completed without difficulty and tolerated well. Medication: triamcinolone  acetonide 40 mg/mL suspension for injection 1 mL total and 2 mL lidocaine  1% without epinephrine  utilized for needle placement anesthetic Advised to contact for fevers/chills, erythema, induration, drainage, or persistent bleeding.  Procedure:  Injection of left shoulder biceps tendon sheath under ultrasound guidance. Ultrasound guidance utilized for  out of plane approach, peritendinous fluid noted Samsung HS60 device utilized with permanent recording / reporting. Verbal informed consent obtained and verified. Skin prepped in a sterile fashion. Ethyl chloride for topical local analgesia.  Completed without difficulty and tolerated well. Medication: triamcinolone  acetonide 40 mg/mL suspension for injection 1 mL total and 2 mL lidocaine  1% without epinephrine  utilized for needle placement anesthetic Advised to contact for fevers/chills, erythema, induration, drainage, or persistent bleeding.   Pertinent History, Exam, Impression, and Recommendations:   Problem List Items Addressed This Visit     Rotator cuff impingement syndrome of left shoulder   See additional assessment(s) for plan details.      Relevant Orders   US  LIMITED JOINT SPACE STRUCTURES UP LEFT   Tendinopathy of left biceps tendon - Primary   Persistent pain with only limited improvement following diclofenac  and PRN tramadol  regimen. Exam focal primarilly to biceps tendon and supraspinatus. We reviewed options given her clinical course and she elected to proceed with ultrasound guided CSI to biceps tendon sheath and subacromial space.  - Post care reviewed - Continue diclofenac  until symptoms respond to CSI - Contact at 2 weeks or beyond if symptoms persist, at which point consider advanced imaging      Relevant Orders   US  LIMITED JOINT SPACE STRUCTURES UP LEFT     Orders & Medications Medications:  Meds ordered this encounter  Medications   triamcinolone  acetonide (KENALOG -40) injection 40 mg   Orders Placed This Encounter  Procedures   US  LIMITED JOINT SPACE STRUCTURES UP LEFT     No follow-ups on file.     Selinda JINNY Ku, MD, [facility]   Primary Care Sports Medicine Primary Care and Sports Medicine at [facility]

## 2023-03-10 NOTE — Assessment & Plan Note (Signed)
 See additional assessment(s) for plan details.

## 2023-03-10 NOTE — Assessment & Plan Note (Signed)
 Persistent pain with only limited improvement following diclofenac  and PRN tramadol  regimen. Exam focal primarilly to biceps tendon and supraspinatus. We reviewed options given her clinical course and she elected to proceed with ultrasound guided CSI to biceps tendon sheath and subacromial space.  - Post care reviewed - Continue diclofenac  until symptoms respond to CSI - Contact at 2 weeks or beyond if symptoms persist, at which point consider advanced imaging

## 2023-03-10 NOTE — Patient Instructions (Signed)
 You have just been given a cortisone injection to reduce pain and inflammation. After the injection you may notice immediate relief of pain as a result of the Lidocaine . It is important to rest the area of the injection for 24 to 48 hours after the injection. There is a possibility of some temporary increased discomfort and swelling for up to 72 hours until the cortisone begins to work. If you do have pain, simply rest the joint and use ice. If you can tolerate over the counter medications, you can try Tylenol , Aleve , or Advil  for added relief per package instructions. - Use diclofenac  twice daily until symptoms respond to cortisone injections - Contact at 2 weeks or beyond if still symptomatic to discuss next steps - Follow-up as-needed

## 2023-03-16 ENCOUNTER — Other Ambulatory Visit: Payer: Self-pay | Admitting: Family Medicine

## 2023-03-16 NOTE — Telephone Encounter (Signed)
Refused Mobic 15 mg because it was discontinued on 03/10/2023

## 2023-03-24 ENCOUNTER — Encounter: Payer: Self-pay | Admitting: Family Medicine

## 2023-03-24 ENCOUNTER — Ambulatory Visit (INDEPENDENT_AMBULATORY_CARE_PROVIDER_SITE_OTHER): Payer: 59 | Admitting: Family Medicine

## 2023-03-24 VITALS — BP 124/78 | HR 69 | Ht 59.0 in | Wt 198.0 lb

## 2023-03-24 DIAGNOSIS — R9431 Abnormal electrocardiogram [ECG] [EKG]: Secondary | ICD-10-CM

## 2023-03-24 DIAGNOSIS — R0789 Other chest pain: Secondary | ICD-10-CM

## 2023-03-24 NOTE — Progress Notes (Signed)
Date:  03/24/2023   Name:  Courtney Cruz   DOB:  Jan 05, 1971   MRN:  [id number]   Chief Complaint: Chest Pain (Chest pain and sob on and off. Patient said for the last few days she has been lightheaded. )  Chest Pain  This is a new problem. The current episode started in the past 7 days. The onset quality is gradual. The problem has been unchanged. The pain is present in the lateral region (left). The quality of the pain is described as sharp. The pain does not radiate. Pertinent negatives include no back pain, cough, dizziness, exertional chest pressure, irregular heartbeat, near-syncope, numbness, palpitations, shortness of breath or weakness.    Lab Results  Component Value Date   NA 144 02/17/2023   K 4.3 02/17/2023   CO2 23 02/17/2023   GLUCOSE 90 02/17/2023   BUN 15 02/17/2023   CREATININE 0.82 02/17/2023   CALCIUM 9.7 02/17/2023   EGFR 85 02/17/2023   GFRNONAA >60 10/18/2019   Lab Results  Component Value Date   CHOL 179 08/11/2022   HDL 70 08/11/2022   LDLCALC 92 08/11/2022   TRIG 96 08/11/2022   No results found for: "TSH" Lab Results  Component Value Date   HGBA1C 6.4 (H) 02/17/2023   Lab Results  Component Value Date   WBC 7.6 05/10/2022   HGB 13.3 05/10/2022   HCT 41.5 05/10/2022   MCV 82 05/10/2022   PLT 299 05/10/2022   Lab Results  Component Value Date   ALT 12 08/11/2022   AST 17 08/11/2022   ALKPHOS 98 08/11/2022   BILITOT 0.2 08/11/2022   No results found for: "25OHVITD2", "25OHVITD3", "VD25OH"   Review of Systems  Respiratory:  Negative for cough, shortness of breath and wheezing.   Cardiovascular:  Positive for chest pain. Negative for palpitations and near-syncope.  Musculoskeletal:  Negative for back pain.  Neurological:  Negative for dizziness, weakness and numbness.    Patient Active Problem List   Diagnosis Date Noted   Tendinopathy of left biceps tendon 03/10/2023   Rotator cuff impingement syndrome of left shoulder  03/06/2023   Encounter for screening colonoscopy    Polyp of transverse colon    Cervical mass 10/16/2019   Status post abdominal hysterectomy 10/16/2019   Menorrhagia 09/25/2019   Uterine mass 09/25/2019    Allergies  Allergen Reactions   Sumatriptan Anaphylaxis and Shortness Of Breath   Latex Rash    Past Surgical History:  Procedure Laterality Date   CESAREAN SECTION     COLONOSCOPY WITH PROPOFOL N/A 09/09/2021   Procedure: COLONOSCOPY WITH PROPOFOL;  Surgeon: Midge Minium, MD;  Location: [facility];  Service: Endoscopy;  Laterality: N/A;   HYSTERECTOMY ABDOMINAL WITH SALPINGECTOMY Bilateral 10/16/2019   Procedure: HYSTERECTOMY ABDOMINAL WITH SALPINGECTOMY;  Surgeon: Conard Novak, MD;  Location: [facility];  Service: Gynecology;  Laterality: Bilateral;   LAPAROTOMY WITH STAGING N/A 10/16/2019   Procedure: LAPAROTOMY EXPLORATORY;  Surgeon: Conard Novak, MD;  Location: [facility];  Service: Gynecology;  Laterality: N/A;    Social History   Tobacco Use   Smoking status: Former   Smokeless tobacco: Never  Vaping Use   Vaping status: Never Used  Substance Use Topics   Alcohol use: Yes    Comment: Occ   Drug use: Never     Medication list has been reviewed and updated.  Current Meds  Medication Sig   Accu-Chek Softclix Lancets lancets USE TO TEST ONCE DAILY  diclofenac (VOLTAREN) 50 MG EC tablet Take 1 tablet (50 mg total) by mouth 2 (two) times daily as needed.   ferrous sulfate 324 MG TBEC Take 324 mg by mouth daily with breakfast.   glucose blood (ACCU-CHEK GUIDE) test strip TEST ONCE DAILY   metFORMIN (GLUCOPHAGE) 500 MG tablet TAKE 1 TABLET BY MOUTH EVERY DAY WITH BREAKFAST   sertraline (ZOLOFT) 25 MG tablet Take 1 tablet (25 mg total) by mouth daily.       03/24/2023    2:54 PM 02/17/2023    2:57 PM 08/11/2022    2:01 PM 05/10/2022    3:03 PM  GAD 7 : Generalized Anxiety Score  Nervous, Anxious, on Edge 0 1 0 0  Control/stop worrying 0 1 0 0   Worry too much - different things 0 2 0 0  Trouble relaxing 0 2 0 0  Restless 0 0 0 0  Easily annoyed or irritable 0 2 0 0  Afraid - awful might happen 0 0 0 0  Total GAD 7 Score 0 8 0 0  Anxiety Difficulty Not difficult at all Somewhat difficult Not difficult at all Not difficult at all       03/24/2023    2:53 PM 02/17/2023    2:56 PM 08/11/2022    2:01 PM  Depression screen PHQ 2/9  Decreased Interest 0 2 0  Down, Depressed, Hopeless 0 2 0  PHQ - 2 Score 0 4 0  Altered sleeping 2 1 0  Tired, decreased energy 2 2 0  Change in appetite 0 1 0  Feeling bad or failure about yourself  0 0 0  Trouble concentrating 0 1 0  Moving slowly or fidgety/restless 0 0 0  Suicidal thoughts 0 0 0  PHQ-9 Score 4 9 0  Difficult doing work/chores Not difficult at all Somewhat difficult Not difficult at all    BP Readings from Last 3 Encounters:  03/24/23 124/78  03/10/23 128/80  03/06/23 (!) 130/90    Physical Exam Eyes:     Pupils: Pupils are equal, round, and reactive to light.  Cardiovascular:     Rate and Rhythm: Normal rate and regular rhythm.     Heart sounds: S1 normal and S2 normal.     No systolic murmur is present.     No diastolic murmur is present.     No gallop. No S3 or S4 sounds.  Pulmonary:     Breath sounds: No decreased breath sounds, wheezing, rhonchi or rales.  Musculoskeletal:     Right lower leg: No edema.     Left lower leg: No edema.  Neurological:     Mental Status: She is alert.     Wt Readings from Last 3 Encounters:  03/24/23 198 lb (89.8 kg)  03/10/23 200 lb (90.7 kg)  03/06/23 200 lb 3.2 oz (90.8 kg)    BP 124/78   Pulse 69   Ht 4\' 11"  (1.499 m)   Wt 198 lb (89.8 kg)   LMP 09/28/2019   SpO2 97%   BMI 39.99 kg/m   Assessment and Plan: 1. Other chest pain (Primary) New onset.  Episodic.  None for several weeks.  Patient had unusual chest pain that was in the right lateral chest area and is returning for further evaluation EKG was done  with the following results.  Rate is normal at 66 PR interval is shortened at 0.104.  No LVH criteria met by voltage.  No ischemic changes noted.  Given  patient's chest pain we will further evaluate with cardiology consult. - EKG 12-Lead  2. Atypical chest pain Patient with atypical chest pain is referred to cardiology. - Ambulatory referral to Cardiology  3. EKG abnormality EKG notes short PR interval which I am not certain of the concern which is noted with this abnormality. - Ambulatory referral to Cardiology     Elizabeth Sauer, MD

## 2023-03-28 ENCOUNTER — Telehealth: Payer: Self-pay | Admitting: Family Medicine

## 2023-03-28 NOTE — Telephone Encounter (Signed)
Copied from CRM [id number]. Topic: General - Inquiry >> Mar 28, 2023 10:02 AM Haroldine Laws wrote: Reason for CRM: [facility] called asking the office to send the EKG to them asap.  She has an appt tomorrow.  FX [phone]

## 2023-03-28 NOTE — Telephone Encounter (Signed)
Faced to [facility].

## 2023-03-29 DIAGNOSIS — R0789 Other chest pain: Secondary | ICD-10-CM | POA: Diagnosis not present

## 2023-04-02 ENCOUNTER — Other Ambulatory Visit: Payer: Self-pay | Admitting: Family Medicine

## 2023-04-04 NOTE — Telephone Encounter (Signed)
Sent patient mychart message  Waiting for response

## 2023-04-04 NOTE — Telephone Encounter (Signed)
 Requested medication (s) are due for refill today:   Provider to review  Requested medication (s) are on the active medication list:   Yes  Future visit scheduled:   Not with Dr. Selinda Ku for her shoulder pain   Last ordered: 03/06/2023 #60, 0 refills  Returned for Dr Ku to review for refills since only a 30 day supply given.   Pt was seen for shoulder pain.      Requested Prescriptions  Pending Prescriptions Disp Refills   diclofenac  (VOLTAREN ) 50 MG EC tablet [Pharmacy Med Name: DICLOFENAC  SOD EC 50 MG TAB] 60 tablet 0    Sig: TAKE 1 TABLET BY MOUTH 2 TIMES DAILY AS NEEDED.     Analgesics:  NSAIDS Failed - 04/04/2023  8:25 AM      Failed - Manual Review: Labs are only required if the patient has taken medication for more than 8 weeks.      Passed - Cr in normal range and within 360 days    Creatinine  Date Value Ref Range Status  05/28/2014 0.80 mg/dL Final    Comment:    9.55-8.99 NOTE: New Reference Range  05/06/14    Creatinine, Ser  Date Value Ref Range Status  02/17/2023 0.82 0.57 - 1.00 mg/dL Final   Creatinine, Urine  Date Value Ref Range Status  10/16/2019 201 mg/dL Final    Comment:    Performed at [facility], [address] [place], [place] [address]         Passed - HGB in normal range and within 360 days    Hemoglobin  Date Value Ref Range Status  05/10/2022 13.3 11.1 - 15.9 g/dL Final         Passed - PLT in normal range and within 360 days    Platelets  Date Value Ref Range Status  05/10/2022 299 150 - 450 x10E3/uL Final         Passed - HCT in normal range and within 360 days    Hematocrit  Date Value Ref Range Status  05/10/2022 41.5 34.0 - 46.6 % Final         Passed - eGFR is 30 or above and within 360 days    EGFR (African American)  Date Value Ref Range Status  05/28/2014 >60  Final   GFR calc Af Amer  Date Value Ref Range Status  10/18/2019 >60 >60 mL/min Final   EGFR (Non-African Amer.)  Date Value Ref  Range Status  05/28/2014 >60  Final    Comment:    eGFR values <88mL/min/1.73 m2 may be an indication of chronic kidney disease (CKD). Calculated eGFR is useful in patients with stable renal function. The eGFR calculation will not be reliable in acutely ill patients when serum creatinine is changing rapidly. It is not useful in patients on dialysis. The eGFR calculation may not be applicable to patients at the low and high extremes of body sizes, pregnant women, and vegetarians.    GFR calc non Af Amer  Date Value Ref Range Status  10/18/2019 >60 >60 mL/min Final   eGFR  Date Value Ref Range Status  02/17/2023 85 >59 mL/min/1.73 Final         Passed - Patient is not pregnant      Passed - Valid encounter within last 12 months    Recent Outpatient Visits           1 week ago Other chest pain    Primary Care &  Sports Medicine at [facility], MD   3 weeks ago Tendinopathy of left biceps tendon   [place] [facility], Selinda PARAS, MD   4 weeks ago Rotator cuff impingement syndrome of left shoulder   [place] [facility], Selinda PARAS, MD   1 month ago Diabetes mellitus treated with oral medication [facility])   [place] [facility] Joshua Cathryne JAYSON, MD   7 months ago Diabetes mellitus treated with oral medication [facility]   [place] [facility] Medicine [facility] Joshua Cathryne JAYSON, MD       Future Appointments             In 2 months Joshua Cathryne JAYSON, MD [facility] Primary Care & Sports Medicine at [facility], [facility]

## 2023-04-06 NOTE — Telephone Encounter (Signed)
 Called and left VM informing patient to schedule appt if still needing this medication so we can take further look at shoulder.  - Mayleigh Tetrault

## 2023-04-26 DIAGNOSIS — R0789 Other chest pain: Secondary | ICD-10-CM | POA: Diagnosis not present

## 2023-05-10 DIAGNOSIS — R0789 Other chest pain: Secondary | ICD-10-CM | POA: Diagnosis not present

## 2023-06-19 ENCOUNTER — Encounter: Payer: Self-pay | Admitting: Family Medicine

## 2023-06-19 ENCOUNTER — Ambulatory Visit: Payer: Self-pay | Admitting: Family Medicine

## 2023-09-07 ENCOUNTER — Telehealth: Payer: Self-pay

## 2023-09-07 NOTE — Telephone Encounter (Signed)
 We are getting refills for this patient. She needs to schedule an appointment with a new provider in the clinic.  Thank you,  - Leane Loring M.

## 2023-09-14 ENCOUNTER — Encounter: Admitting: Physician Assistant

## 2023-10-05 ENCOUNTER — Encounter: Admitting: Physician Assistant

## 2023-10-26 ENCOUNTER — Encounter: Payer: Self-pay | Admitting: Physician Assistant

## 2023-10-26 ENCOUNTER — Ambulatory Visit (INDEPENDENT_AMBULATORY_CARE_PROVIDER_SITE_OTHER): Admitting: Physician Assistant

## 2023-10-26 VITALS — BP 140/94 | HR 83 | Temp 98.3°F | Ht 59.0 in | Wt 203.0 lb

## 2023-10-26 DIAGNOSIS — N951 Menopausal and female climacteric states: Secondary | ICD-10-CM

## 2023-10-26 DIAGNOSIS — E66813 Obesity, class 3: Secondary | ICD-10-CM | POA: Insufficient documentation

## 2023-10-26 DIAGNOSIS — E1169 Type 2 diabetes mellitus with other specified complication: Secondary | ICD-10-CM

## 2023-10-26 DIAGNOSIS — R030 Elevated blood-pressure reading, without diagnosis of hypertension: Secondary | ICD-10-CM | POA: Diagnosis not present

## 2023-10-26 DIAGNOSIS — R519 Headache, unspecified: Secondary | ICD-10-CM | POA: Insufficient documentation

## 2023-10-26 DIAGNOSIS — G8929 Other chronic pain: Secondary | ICD-10-CM

## 2023-10-26 DIAGNOSIS — Z7984 Long term (current) use of oral hypoglycemic drugs: Secondary | ICD-10-CM | POA: Insufficient documentation

## 2023-10-26 DIAGNOSIS — J302 Other seasonal allergic rhinitis: Secondary | ICD-10-CM | POA: Insufficient documentation

## 2023-10-26 DIAGNOSIS — M25512 Pain in left shoulder: Secondary | ICD-10-CM

## 2023-10-26 LAB — POCT GLYCOSYLATED HEMOGLOBIN (HGB A1C): Hemoglobin A1C: 6.3 % — AB (ref 4.0–5.6)

## 2023-10-26 MED ORDER — DICLOFENAC SODIUM 1 % EX GEL
2.0000 g | Freq: Four times a day (QID) | CUTANEOUS | 1 refills | Status: AC
Start: 2023-10-26 — End: ?

## 2023-10-26 MED ORDER — DICLOFENAC SODIUM 50 MG PO TBEC
50.0000 mg | DELAYED_RELEASE_TABLET | Freq: Two times a day (BID) | ORAL | 0 refills | Status: DC | PRN
Start: 2023-10-26 — End: 2023-11-22

## 2023-10-26 MED ORDER — VENLAFAXINE HCL ER 37.5 MG PO CP24
37.5000 mg | ORAL_CAPSULE | Freq: Every day | ORAL | 0 refills | Status: DC
Start: 2023-10-26 — End: 2023-11-23

## 2023-10-26 NOTE — Assessment & Plan Note (Signed)
 Patient was previously on sertraline  for anxiety/depression.  Will stop this.  Try low-dose venlafaxine  XR instead which might help with hot flashes.

## 2023-10-26 NOTE — Progress Notes (Signed)
 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 Cathryne Molt.  She has a history of mild type 2 diabetes, was taking metformin  500 mg daily for this with last A1c 6.4% in December.  She has not taken any of her medications since April due to running out, including metformin , sertraline , diclofenac .  Complains of recurrent/chronic left shoulder pain previously evaluated by my colleague Dr. Selinda Ku who prescribed a oral diclofenac  for as needed use.  Patient would like a refill on this.  She has never tried the topical diclofenac .  Patient is interested in pharmacotherapy options for weight loss, but this will be discussed at a future visit.  She has never taken any medication for weight loss.  At present she has no dedicated exercise but reports a fairly nutritive diet though not discussed thoroughly.   Medication list has been reviewed and updated.  Current Meds  Medication Sig   Accu-Chek Softclix Lancets lancets USE TO TEST ONCE DAILY   diclofenac  Sodium (VOLTAREN ) 1 % GEL Apply 2 g topically 4 (four) times daily. Use on affected joint up to 4x/day as needed. 2 grams is roughly 4 fingertips' worth of gel.   ferrous sulfate 324 MG TBEC Take 324 mg by mouth daily with breakfast.   glucose blood (ACCU-CHEK GUIDE) test strip TEST ONCE DAILY   venlafaxine  XR (EFFEXOR -XR) 37.5 MG 24 hr capsule Take 1 capsule (37.5 mg total) by mouth daily with breakfast.   [DISCONTINUED] metFORMIN  (GLUCOPHAGE ) 500 MG tablet TAKE 1 TABLET BY MOUTH EVERY DAY WITH BREAKFAST   [DISCONTINUED] sertraline  (ZOLOFT ) 25 MG tablet Take 1 tablet (25 mg total) by mouth daily.     Review of Systems  Patient Active Problem List   Diagnosis Date Noted   Class 3 severe  obesity with serious comorbidity in adult 10/26/2023   Chronic left shoulder pain 10/26/2023   Hot flashes due to menopause 10/26/2023   Elevated blood pressure reading without diagnosis of hypertension 10/26/2023   Seasonal allergies    Diet-controlled type 2 diabetes mellitus with other specified complication (HCC)    Tendinopathy of left biceps tendon 03/10/2023   Rotator cuff impingement syndrome of left shoulder 03/06/2023   Polyp of transverse colon    Migraine 05/23/2011    Allergies  Allergen Reactions   Sumatriptan Anaphylaxis and Shortness Of Breath   Latex Rash    Immunization History  Administered Date(s) Administered   Tdap 09/12/2012    Past Surgical History:  Procedure Laterality Date   ABDOMINAL HYSTERECTOMY     CESAREAN SECTION     COLONOSCOPY WITH PROPOFOL  N/A 09/09/2021   Procedure: COLONOSCOPY WITH PROPOFOL ;  Surgeon: Jinny Carmine, MD;  Location: [facility];  Service: Endoscopy;  Laterality: N/A;   HYSTERECTOMY ABDOMINAL WITH SALPINGECTOMY Bilateral 10/16/2019   Procedure: HYSTERECTOMY ABDOMINAL WITH SALPINGECTOMY;  Surgeon: Leonce Garnette BIRCH, MD;  Location: [facility];  Service: Gynecology;  Laterality: Bilateral;   LAPAROTOMY WITH STAGING N/A 10/16/2019   Procedure: LAPAROTOMY EXPLORATORY;  Surgeon: Leonce Garnette BIRCH, MD;  Location: [facility];  Service: Gynecology;  Laterality: N/A;    Social History   Tobacco Use   Smoking status: Former   Smokeless tobacco: Never  Advertising account planner  Vaping status: Never Used  Substance Use Topics   Alcohol use: Yes    Comment: Occ   Drug use: Never    Family History  Problem Relation Age of Onset   Diabetes Sister    Stroke Maternal Grandfather    Diabetes Maternal Grandfather    Hypertension Maternal Grandfather    Breast cancer Paternal Grandmother    Cancer Paternal Grandmother         10/26/2023    3:38 PM 03/24/2023    2:54 PM 02/17/2023    2:57 PM 08/11/2022    2:01 PM  GAD 7 : Generalized  Anxiety Score  Nervous, Anxious, on Edge 0 0 1 0  Control/stop worrying 0 0 1 0  Worry too much - different things 0 0 2 0  Trouble relaxing 0 0 2 0  Restless 0 0 0 0  Easily annoyed or irritable 2 0 2 0  Afraid - awful might happen 0 0 0 0  Total GAD 7 Score 2 0 8 0  Anxiety Difficulty Not difficult at all Not difficult at all Somewhat difficult Not difficult at all       10/26/2023    3:38 PM 03/24/2023    2:53 PM 02/17/2023    2:56 PM  Depression screen PHQ 2/9  Decreased Interest 0 0 2  Down, Depressed, Hopeless 0 0 2  PHQ - 2 Score 0 0 4  Altered sleeping 2 2 1   Tired, decreased energy 2 2 2   Change in appetite 0 0 1  Feeling bad or failure about yourself  0 0 0  Trouble concentrating 0 0 1  Moving slowly or fidgety/restless 0 0 0  Suicidal thoughts 0 0 0  PHQ-9 Score 4 4 9   Difficult doing work/chores Not difficult at all Not difficult at all Somewhat difficult    BP Readings from Last 3 Encounters:  10/26/23 (!) 140/94  03/24/23 124/78  03/10/23 128/80    Wt Readings from Last 3 Encounters:  10/26/23 203 lb (92.1 kg)  03/24/23 198 lb (89.8 kg)  03/10/23 200 lb (90.7 kg)    BP (!) 140/94 (Cuff Size: Large)   Pulse 83   Temp 98.3 F (36.8 C)   Ht 4' 11 (1.499 m)   Wt 203 lb (92.1 kg)   LMP 09/28/2019   SpO2 98%   BMI 41.00 kg/m   Physical Exam Vitals and nursing note reviewed.  Constitutional:      Appearance: Normal appearance.  Cardiovascular:     Rate and Rhythm: Normal rate.  Pulmonary:     Effort: Pulmonary effort is normal.  Abdominal:     General: There is no distension.  Musculoskeletal:        General: Normal range of motion.  Skin:    General: Skin is warm and dry.  Neurological:     Mental Status: She is alert and oriented to person, place, and time.     Gait: Gait is intact.  Psychiatric:        Mood and Affect: Mood and affect normal.     Recent Labs     Component Value Date/Time   NA 144 02/17/2023 1605   NA 137  05/28/2014 2307   K 4.3 02/17/2023 1605   K 3.7 05/28/2014 2307   CL 108 (H) 02/17/2023 1605   CL 107 05/28/2014 2307   CO2 23 02/17/2023 1605   CO2 25 05/28/2014 2307   GLUCOSE 90 02/17/2023 1605   GLUCOSE 135 (  H) 10/18/2019 0618   GLUCOSE 121 (H) 05/28/2014 2307   BUN 15 02/17/2023 1605   BUN 10 05/28/2014 2307   CREATININE 0.82 02/17/2023 1605   CREATININE 0.80 05/28/2014 2307   CALCIUM 9.7 02/17/2023 1605   CALCIUM 8.7 (L) 05/28/2014 2307   PROT 7.7 08/11/2022 1442   PROT 7.4 12/14/2012 2033   ALBUMIN 4.5 02/17/2023 1605   ALBUMIN 3.1 (L) 12/14/2012 2033   AST 17 08/11/2022 1442   AST 23 12/14/2012 2033   ALT 12 08/11/2022 1442   ALT 16 12/14/2012 2033   ALKPHOS 98 08/11/2022 1442   ALKPHOS 74 12/14/2012 2033   BILITOT 0.2 08/11/2022 1442   BILITOT 0.2 12/14/2012 2033   GFRNONAA >60 10/18/2019 0618   GFRNONAA >60 05/28/2014 2307   GFRAA >60 10/18/2019 0618   GFRAA >60 05/28/2014 2307    Lab Results  Component Value Date   WBC 7.6 05/10/2022   HGB 13.3 05/10/2022   HCT 41.5 05/10/2022   MCV 82 05/10/2022   PLT 299 05/10/2022   Lab Results  Component Value Date   HGBA1C 6.3 (A) 10/26/2023   HGBA1C 6.4 (H) 02/17/2023   HGBA1C 6.4 (H) 08/11/2022   Lab Results  Component Value Date   CHOL 179 08/11/2022   HDL 70 08/11/2022   LDLCALC 92 08/11/2022   TRIG 96 08/11/2022   No results found for: TSH    Assessment and Plan:  Diet-controlled type 2 diabetes mellitus with other specified complication, without long-term current use of insulin (HCC) Assessment & Plan: Stop metformin , A1c 6.3% today without it for the last 4 months. Recommend diet low in simple carbohydrates such as white starches (bread, pasta, rice) and refined sugar found in desserts and sweetened beverages including juice, sweet tea, and soda.   Recommend routine physical activity which can start small (10 min daily walk) with eventual goal of 150 min of heart rate elevation per week.  Examples could include brisk walk, stationary bike, etc. At least twice per week, I recommend some type of resistance training which could be weights, resistance bands, or body weight exercises - this would count toward the weekly goal.   Orders: -     POCT glycosylated hemoglobin (Hb A1C)  Chronic left shoulder pain Assessment & Plan: Try topical diclofenac  preferentially, but I will be refilling her oral diclofenac  for use as needed.  Orders: -     Diclofenac  Sodium; Take 1 tablet (50 mg total) by mouth 2 (two) times daily as needed.  Dispense: 60 tablet; Refill: 0 -     Diclofenac  Sodium; Apply 2 g topically 4 (four) times daily. Use on affected joint up to 4x/day as needed. 2 grams is roughly 4 fingertips' worth of gel.  Dispense: 100 g; Refill: 1  Hot flashes due to menopause Assessment & Plan: Patient was previously on sertraline  for anxiety/depression.  Will stop this.  Try low-dose venlafaxine  XR instead which might help with hot flashes.  Orders: -     Venlafaxine  HCl ER; Take 1 capsule (37.5 mg total) by mouth daily with breakfast.  Dispense: 30 capsule; Refill: 0  Elevated blood pressure reading without diagnosis of hypertension Assessment & Plan: No pharmacotherapy needed at this time, she has never taken blood pressure medication before and historically has good control.  She does have access to a blood pressure cuff at home, so I have advised her to check twice per week and bring a log for my review next visit.  Return in about 4 weeks (around 11/23/2023) for OV f/u hot flashes, shoulder, wgt mgmt.    Rolan Hoyle, PA-C, DMSc, Nutritionist [facility] [phone]

## 2023-10-26 NOTE — Assessment & Plan Note (Signed)
 Stop metformin , A1c 6.3% today without it for the last 4 months. Recommend diet low in simple carbohydrates such as white starches (bread, pasta, rice) and refined sugar found in desserts and sweetened beverages including juice, sweet tea, and soda.   Recommend routine physical activity which can start small (10 min daily walk) with eventual goal of 150 min of heart rate elevation per week. Examples could include brisk walk, stationary bike, etc. At least twice per week, I recommend some type of resistance training which could be weights, resistance bands, or body weight exercises - this would count toward the weekly goal.

## 2023-10-26 NOTE — Assessment & Plan Note (Signed)
 No pharmacotherapy needed at this time, she has never taken blood pressure medication before and historically has good control.  She does have access to a blood pressure cuff at home, so I have advised her to check twice per week and bring a log for my review next visit.

## 2023-10-26 NOTE — Assessment & Plan Note (Signed)
 Try topical diclofenac  preferentially, but I will be refilling her oral diclofenac  for use as needed.

## 2023-11-17 ENCOUNTER — Other Ambulatory Visit: Payer: Self-pay | Admitting: Physician Assistant

## 2023-11-17 DIAGNOSIS — N951 Menopausal and female climacteric states: Secondary | ICD-10-CM

## 2023-11-17 NOTE — Telephone Encounter (Signed)
 Requested medications are due for refill today.  A little too soon - pt is requesting a 90 day supply  Requested medications are on the active medications list.  yes  Last refill. 10/26/2023 #30 0 rf  Future visit scheduled.   yes  Notes to clinic.  New medication to this pt.    Requested Prescriptions  Pending Prescriptions Disp Refills   venlafaxine  XR (EFFEXOR -XR) 37.5 MG 24 hr capsule [Pharmacy Med Name: VENLAFAXINE  HCL ER 37.5 MG CAP] 90 capsule 1    Sig: TAKE 1 CAPSULE BY MOUTH DAILY WITH BREAKFAST.     Psychiatry: Antidepressants - SNRI - desvenlafaxine & venlafaxine  Failed - 11/17/2023  4:59 PM      Failed - Last BP in normal range    BP Readings from Last 1 Encounters:  10/26/23 (!) 140/94         Failed - Lipid Panel in normal range within the last 12 months    Cholesterol, Total  Date Value Ref Range Status  08/11/2022 179 100 - 199 mg/dL Final   LDL Chol Calc (NIH)  Date Value Ref Range Status  08/11/2022 92 0 - 99 mg/dL Final   HDL  Date Value Ref Range Status  08/11/2022 70 >39 mg/dL Final   Triglycerides  Date Value Ref Range Status  08/11/2022 96 0 - 149 mg/dL Final         Passed - Cr in normal range and within 360 days    Creatinine  Date Value Ref Range Status  05/28/2014 0.80 mg/dL Final    Comment:    9.55-8.99 NOTE: New Reference Range  05/06/14    Creatinine, Ser  Date Value Ref Range Status  02/17/2023 0.82 0.57 - 1.00 mg/dL Final   Creatinine, Urine  Date Value Ref Range Status  10/16/2019 201 mg/dL Final    Comment:    Performed at [facility], [address], [place], [place] [address]         Passed - Valid encounter within last 6 months    Recent Outpatient Visits           3 weeks ago Diet-controlled type 2 diabetes mellitus with other specified complication, without long-term current use of insulin [facility])   [facility] Medicine at [facility], [place], [place]

## 2023-11-21 ENCOUNTER — Other Ambulatory Visit: Payer: Self-pay | Admitting: Physician Assistant

## 2023-11-21 DIAGNOSIS — G8929 Other chronic pain: Secondary | ICD-10-CM

## 2023-11-22 NOTE — Telephone Encounter (Signed)
 Requested Prescriptions  Pending Prescriptions Disp Refills   diclofenac  (VOLTAREN ) 50 MG EC tablet [Pharmacy Med Name: DICLOFENAC  SOD EC 50 MG TAB] 180 tablet 0    Sig: TAKE 1 TABLET BY MOUTH 2 TIMES DAILY AS NEEDED.     Analgesics:  NSAIDS Failed - 11/22/2023 12:49 PM      Failed - Manual Review: Labs are only required if the patient has taken medication for more than 8 weeks.      Failed - HGB in normal range and within 360 days    Hemoglobin  Date Value Ref Range Status  05/10/2022 13.3 11.1 - 15.9 g/dL Final         Failed - PLT in normal range and within 360 days    Platelets  Date Value Ref Range Status  05/10/2022 299 150 - 450 x10E3/uL Final         Failed - HCT in normal range and within 360 days    Hematocrit  Date Value Ref Range Status  05/10/2022 41.5 34.0 - 46.6 % Final         Passed - Cr in normal range and within 360 days    Creatinine  Date Value Ref Range Status  05/28/2014 0.80 mg/dL Final    Comment:    9.55-8.99 NOTE: New Reference Range  05/06/14    Creatinine, Ser  Date Value Ref Range Status  02/17/2023 0.82 0.57 - 1.00 mg/dL Final   Creatinine, Urine  Date Value Ref Range Status  10/16/2019 201 mg/dL Final    Comment:    Performed at [facility], [address] [place], [place] [address]         Passed - eGFR is 30 or above and within 360 days    EGFR (African American)  Date Value Ref Range Status  05/28/2014 >60  Final   GFR calc Af Amer  Date Value Ref Range Status  10/18/2019 >60 >60 mL/min Final   EGFR (Non-African Amer.)  Date Value Ref Range Status  05/28/2014 >60  Final    Comment:    eGFR values <53mL/min/1.73 m2 may be an indication of chronic kidney disease (CKD). Calculated eGFR is useful in patients with stable renal function. The eGFR calculation will not be reliable in acutely ill patients when serum creatinine is changing rapidly. It is not useful in patients on dialysis. The eGFR calculation may  not be applicable to patients at the low and high extremes of body sizes, pregnant women, and vegetarians.    GFR calc non Af Amer  Date Value Ref Range Status  10/18/2019 >60 >60 mL/min Final   eGFR  Date Value Ref Range Status  02/17/2023 85 >59 mL/min/1.73 Final         Passed - Patient is not pregnant      Passed - Valid encounter within last 12 months    Recent Outpatient Visits           3 weeks ago Diet-controlled type 2 diabetes mellitus with other specified complication, without long-term current use of insulin [facility])   [facility] Primary [facility] & [facility] Medicine at [facility], [place], [place]

## 2023-11-23 ENCOUNTER — Ambulatory Visit: Payer: Self-pay | Admitting: Physician Assistant

## 2023-12-04 ENCOUNTER — Ambulatory Visit: Admitting: Physician Assistant

## 2023-12-06 ENCOUNTER — Other Ambulatory Visit (HOSPITAL_COMMUNITY): Payer: Self-pay

## 2023-12-06 ENCOUNTER — Telehealth: Payer: Self-pay | Admitting: Pharmacy Technician

## 2023-12-06 ENCOUNTER — Ambulatory Visit (INDEPENDENT_AMBULATORY_CARE_PROVIDER_SITE_OTHER): Admitting: Physician Assistant

## 2023-12-06 ENCOUNTER — Telehealth: Payer: Self-pay | Admitting: Physician Assistant

## 2023-12-06 ENCOUNTER — Encounter: Payer: Self-pay | Admitting: Physician Assistant

## 2023-12-06 VITALS — BP 136/66 | HR 92 | Temp 97.8°F | Ht 59.0 in | Wt 202.0 lb

## 2023-12-06 DIAGNOSIS — Z6841 Body Mass Index (BMI) 40.0 and over, adult: Secondary | ICD-10-CM

## 2023-12-06 DIAGNOSIS — E66813 Obesity, class 3: Secondary | ICD-10-CM

## 2023-12-06 DIAGNOSIS — R030 Elevated blood-pressure reading, without diagnosis of hypertension: Secondary | ICD-10-CM

## 2023-12-06 DIAGNOSIS — E1169 Type 2 diabetes mellitus with other specified complication: Secondary | ICD-10-CM | POA: Diagnosis not present

## 2023-12-06 DIAGNOSIS — N951 Menopausal and female climacteric states: Secondary | ICD-10-CM | POA: Diagnosis not present

## 2023-12-06 MED ORDER — VENLAFAXINE HCL ER 37.5 MG PO CP24
37.5000 mg | ORAL_CAPSULE | Freq: Every day | ORAL | 0 refills | Status: AC
Start: 2023-12-06 — End: ?

## 2023-12-06 NOTE — Assessment & Plan Note (Signed)
 Last A1c 6.3% in Aug without medication. Could consider Ozempic if Courtney Cruz is not approved.

## 2023-12-06 NOTE — Assessment & Plan Note (Signed)
 Discussed potentially increasing dose of venlafaxine  but will keep the same for now since she is getting some benefit and also may be starting weight loss medication. Patient might benefit from HRT, so I encouraged her to seek consult with GYN.

## 2023-12-06 NOTE — Progress Notes (Signed)
 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 No side effects. Does not presently have GYN.   Adding topical diclofenac  to her regimen for shoulder pain has really helped.   Patient is interested in pharmacotherapy options for weight loss. Says she really noticed it became much harder to control her weight when she hit perimenopause. She has never taken any medication for weight loss.  Reports a fairly nutritive diet, typically two meals per day (not much of a breakfast person). For the last 2 weeks she has been trying intermittent fasting with her husband; says it's working much better for him than it is for her. Mostly home-cooked meals, estimates she has either fast food or restaurant once per week on average. Husband sometimes fusses with her for having too small of portion sizes. Very few calories from beverages, only rarely consumes a soda every once in a while (says a 20 oz soda takes her several days to drink), does not drink juice or alcohol. Does not overindulge or have hedonic eating patterns. Occasionally has dessert, which is typically prompted by her husband wanting dessert. Since our last conversation she has been walking at least once per week with her sister, typically about 2 miles.    Medication list has been reviewed and updated.  Current Meds  Medication Sig   diclofenac  (VOLTAREN ) 50 MG EC tablet TAKE 1 TABLET BY MOUTH 2 TIMES DAILY AS NEEDED.   diclofenac  Sodium (VOLTAREN ) 1 % GEL Apply 2 g  topically 4 (four) times daily. Use on affected joint up to 4x/day as needed. 2 grams is roughly 4 fingertips' worth of gel.   ferrous sulfate 324 MG TBEC Take 324 mg by mouth daily with breakfast.   [DISCONTINUED] venlafaxine  XR (EFFEXOR -XR) 37.5 MG 24 hr capsule TAKE 1 CAPSULE BY MOUTH DAILY WITH BREAKFAST.     Review of Systems  Patient Active Problem List   Diagnosis Date Noted   Class 3 severe obesity with serious comorbidity in adult [facility]) 10/26/2023   Chronic left shoulder pain 10/26/2023   Hot flashes due to menopause 10/26/2023   Elevated blood pressure reading without diagnosis of hypertension 10/26/2023   Seasonal allergies    Diet-controlled type 2 diabetes mellitus with other specified complication (HCC)    Tendinopathy of left biceps tendon 03/10/2023   Rotator cuff impingement syndrome of left shoulder 03/06/2023   Polyp of transverse colon    Migraine 05/23/2011    Allergies  Allergen Reactions   Sumatriptan Anaphylaxis and Shortness Of Breath   Latex Rash    Immunization History  Administered Date(s) Administered   Tdap 09/12/2012    Past Surgical History:  Procedure Laterality Date   ABDOMINAL HYSTERECTOMY     CESAREAN SECTION     COLONOSCOPY WITH PROPOFOL  N/A 09/09/2021   Procedure: COLONOSCOPY WITH PROPOFOL ;  Surgeon: Jinny Carmine, MD;  Location: [facility];  Service: Endoscopy;  Laterality: N/A;   HYSTERECTOMY ABDOMINAL WITH  SALPINGECTOMY Bilateral 10/16/2019   Procedure: HYSTERECTOMY ABDOMINAL WITH SALPINGECTOMY;  Surgeon: Leonce Garnette BIRCH, MD;  Location: [facility];  Service: Gynecology;  Laterality: Bilateral;   LAPAROTOMY WITH STAGING N/A 10/16/2019   Procedure: LAPAROTOMY EXPLORATORY;  Surgeon: Leonce Garnette BIRCH, MD;  Location: [facility];  Service: Gynecology;  Laterality: N/A;    Social History   Tobacco Use   Smoking status: Former   Smokeless tobacco: Never  Vaping Use   Vaping status: Never Used  Substance Use Topics    Alcohol use: Yes    Comment: Occ   Drug use: Never    Family History  Problem Relation Age of Onset   Diabetes Sister    Stroke Maternal Grandfather    Diabetes Maternal Grandfather    Hypertension Maternal Grandfather    Breast cancer Paternal Grandmother    Cancer Paternal Grandmother         12/06/2023    2:12 PM 10/26/2023    3:38 PM 03/24/2023    2:54 PM 02/17/2023    2:57 PM  GAD 7 : Generalized Anxiety Score  Nervous, Anxious, on Edge 0 0 0 1  Control/stop worrying 0 0 0 1  Worry too much - different things 0 0 0 2  Trouble relaxing 0 0 0 2  Restless 0 0 0 0  Easily annoyed or irritable 0 2 0 2  Afraid - awful might happen 0 0 0 0  Total GAD 7 Score 0 2 0 8  Anxiety Difficulty Not difficult at all Not difficult at all Not difficult at all Somewhat difficult       12/06/2023    2:12 PM 10/26/2023    3:38 PM 03/24/2023    2:53 PM  Depression screen PHQ 2/9  Decreased Interest 0 0 0  Down, Depressed, Hopeless 0 0 0  PHQ - 2 Score 0 0 0  Altered sleeping  2 2  Tired, decreased energy  2 2  Change in appetite  0 0  Feeling bad or failure about yourself   0 0  Trouble concentrating  0 0  Moving slowly or fidgety/restless  0 0  Suicidal thoughts  0 0  PHQ-9 Score  4 4  Difficult doing work/chores  Not difficult at all Not difficult at all    BP Readings from Last 3 Encounters:  12/06/23 136/66  10/26/23 (!) 140/94  03/24/23 124/78    Wt Readings from Last 3 Encounters:  12/06/23 202 lb (91.6 kg)  10/26/23 203 lb (92.1 kg)  03/24/23 198 lb (89.8 kg)    BP 136/66 (Cuff Size: Large)   Pulse 92   Temp 97.8 F (36.6 C)   Ht 4' 11 (1.499 m)   Wt 202 lb (91.6 kg)   LMP 09/28/2019   SpO2 98%   BMI 40.80 kg/m   Physical Exam Vitals and nursing note reviewed.  Constitutional:      Appearance: Normal appearance. She is obese.  Cardiovascular:     Rate and Rhythm: Normal rate.  Pulmonary:     Effort: Pulmonary effort is normal.  Abdominal:      General: There is no distension.  Musculoskeletal:        General: Normal range of motion.  Skin:    General: Skin is warm and dry.  Neurological:     Mental Status: She is alert and oriented to person, place, and time.     Gait: Gait is intact.  Psychiatric:  Mood and Affect: Mood and affect normal.     Recent Labs     Component Value Date/Time   NA 144 02/17/2023 1605   NA 137 05/28/2014 2307   K 4.3 02/17/2023 1605   K 3.7 05/28/2014 2307   CL 108 (H) 02/17/2023 1605   CL 107 05/28/2014 2307   CO2 23 02/17/2023 1605   CO2 25 05/28/2014 2307   GLUCOSE 90 02/17/2023 1605   GLUCOSE 135 (H) 10/18/2019 0618   GLUCOSE 121 (H) 05/28/2014 2307   BUN 15 02/17/2023 1605   BUN 10 05/28/2014 2307   CREATININE 0.82 02/17/2023 1605   CREATININE 0.80 05/28/2014 2307   CALCIUM 9.7 02/17/2023 1605   CALCIUM 8.7 (L) 05/28/2014 2307   PROT 7.7 08/11/2022 1442   PROT 7.4 12/14/2012 2033   ALBUMIN 4.5 02/17/2023 1605   ALBUMIN 3.1 (L) 12/14/2012 2033   AST 17 08/11/2022 1442   AST 23 12/14/2012 2033   ALT 12 08/11/2022 1442   ALT 16 12/14/2012 2033   ALKPHOS 98 08/11/2022 1442   ALKPHOS 74 12/14/2012 2033   BILITOT 0.2 08/11/2022 1442   BILITOT 0.2 12/14/2012 2033   GFRNONAA >60 10/18/2019 0618   GFRNONAA >60 05/28/2014 2307   GFRAA >60 10/18/2019 0618   GFRAA >60 05/28/2014 2307    Lab Results  Component Value Date   WBC 7.6 05/10/2022   HGB 13.3 05/10/2022   HCT 41.5 05/10/2022   MCV 82 05/10/2022   PLT 299 05/10/2022   Lab Results  Component Value Date   HGBA1C 6.3 (A) 10/26/2023   HGBA1C 6.4 (H) 02/17/2023   HGBA1C 6.4 (H) 08/11/2022   Lab Results  Component Value Date   CHOL 179 08/11/2022   HDL 70 08/11/2022   LDLCALC 92 08/11/2022   TRIG 96 08/11/2022   No results found for: TSH    Assessment and Plan:  Class 3 severe obesity with serious comorbidity and body mass index (BMI) of 40.0 to 44.9 in adult, unspecified obesity type [facility]) Assessment  & Plan: We discussed several options for pharmacotherapy today. Stimulants not ideal with borderline HTN and also unlikely to help much, as appetite does not seem to be the primary issue. Metformin  was not particularly useful to her when she was taking that. Contrave might help but she does not really struggle with hedonic/emotional eating or overeating. After much discourse, through shared decision-making we decided to proceed with GLP1RA medication. Will initiate prior-auth for [facility] Demonstrated use of pen with patient today.   No personal or fam hx thyroid cancer or MEN2. No hx pancreatitis or gallstones. We discussed MOA and common side effects. Demonstrated use of pen during our visit today.   Advised the importance of adequate protein intake on this medication as well as resistance training which will not only improve insulin sensitivity but also build muscle mass and reduce risk of atrophy.  I recommended minimum 20 min sessions 3x/wk. Advised importance of portion control and slower eating on this medication to minimize GI side effects. Avoid high fat foods as these may cause discomfort. Patient aware of potential for weight regain when eventually stopping the medication. For this reason, continued efforts toward improving lifestyle will be particularly important moving forward.   Consider also HRT to potentially achieve some modest weight loss while helping hot flashes. Would defer to GYN on that.    Diet-controlled type 2 diabetes mellitus with other specified complication, without long-term current use of insulin (HCC) Assessment & Plan: Last  A1c 6.3% in Aug without medication. Could consider Ozempic if Georjean is not approved.    Elevated blood pressure reading without diagnosis of hypertension Assessment & Plan: Has not been monitoring BP at home, so I encouraged her to do so, with log for my review next time.    Hot flashes due to menopause Assessment & Plan: Discussed  potentially increasing dose of venlafaxine  but will keep the same for now since she is getting some benefit and also may be starting weight loss medication. Patient might benefit from HRT, so I encouraged her to seek consult with GYN.   Orders: -     Venlafaxine  HCl ER; Take 1 capsule (37.5 mg total) by mouth daily with breakfast.  Dispense: 90 capsule; Refill: 0     F/u TBD pending medication approval, but plan for roughly 1 month from now.    Rolan Hoyle, PA-C, DMSc, Nutritionist [facility] Primary Care and Sports Medicine [facility] [phone]

## 2023-12-06 NOTE — Assessment & Plan Note (Signed)
 Has not been monitoring BP at home, so I encouraged her to do so, with log for my review next time.

## 2023-12-06 NOTE — Telephone Encounter (Signed)
 PA request has been Received. New Encounter has been or will be created for follow up. For additional info see Pharmacy Prior Auth telephone encounter from 12/06/23.

## 2023-12-06 NOTE — Assessment & Plan Note (Addendum)
 We discussed several options for pharmacotherapy today. Stimulants not ideal with borderline HTN and also unlikely to help much, as appetite does not seem to be the primary issue. Metformin  was not particularly useful to her when she was taking that. Contrave might help but she does not really struggle with hedonic/emotional eating or overeating. After much discourse, through shared decision-making we decided to proceed with GLP1RA medication. Will initiate prior-auth for [facility]. Demonstrated use of pen with patient today.   No personal or fam hx thyroid cancer or MEN2. No hx pancreatitis or gallstones. We discussed MOA and common side effects. Demonstrated use of pen during our visit today.   Advised the importance of adequate protein intake on this medication as well as resistance training which will not only improve insulin sensitivity but also build muscle mass and reduce risk of atrophy.  I recommended minimum 20 min sessions 3x/wk. Advised importance of portion control and slower eating on this medication to minimize GI side effects. Avoid high fat foods as these may cause discomfort. Patient aware of potential for weight regain when eventually stopping the medication. For this reason, continued efforts toward improving lifestyle will be particularly important moving forward.   Consider also HRT to potentially achieve some modest weight loss while helping hot flashes. Would defer to GYN on that.

## 2023-12-06 NOTE — Telephone Encounter (Signed)
 Please initiate prior auth for [facility] for class 3 severe obesity with serious comorbidity of diabetes and HTN.

## 2023-12-06 NOTE — Telephone Encounter (Signed)
 Pharmacy Patient Advocate Encounter   Received notification from Pt Calls Messages that prior authorization for [facility] is required/requested.   Insurance verification completed.   The patient is insured through [employer].   Per test claim: Per test claim, medication is not covered due to plan/benefit exclusion, PA not submitted at this time

## 2023-12-07 ENCOUNTER — Other Ambulatory Visit: Payer: Self-pay | Admitting: Physician Assistant

## 2023-12-07 ENCOUNTER — Other Ambulatory Visit (HOSPITAL_COMMUNITY): Payer: Self-pay

## 2023-12-07 ENCOUNTER — Telehealth: Payer: Self-pay | Admitting: Pharmacy Technician

## 2023-12-07 ENCOUNTER — Telehealth: Payer: Self-pay | Admitting: Physician Assistant

## 2023-12-07 DIAGNOSIS — E1169 Type 2 diabetes mellitus with other specified complication: Secondary | ICD-10-CM

## 2023-12-07 DIAGNOSIS — Z7985 Long-term (current) use of injectable non-insulin antidiabetic drugs: Secondary | ICD-10-CM | POA: Insufficient documentation

## 2023-12-07 MED ORDER — OZEMPIC (0.25 OR 0.5 MG/DOSE) 2 MG/3ML ~~LOC~~ SOPN
PEN_INJECTOR | SUBCUTANEOUS | Status: DC
Start: 2023-12-07 — End: 2024-01-15

## 2023-12-07 NOTE — Telephone Encounter (Signed)
 Pharmacy Patient Advocate Encounter  Received notification from [employer] that Prior Authorization for Ozempic (0.25 or 0.5 MG/DOSE) 2MG /3ML pen-injectors has been APPROVED from 12/07/23 to 12/06/24. Ran test claim, Copay is $24.99. This test claim was processed through [employer]- copay amounts may vary at other pharmacies due to pharmacy/plan contracts, or as the patient moves through the different stages of their insurance plan.   PA #/Case ID/Reference #: [id number]

## 2023-12-07 NOTE — Telephone Encounter (Signed)
 Pharmacy Patient Advocate Encounter   Received notification from Pt Calls Messages that prior authorization for Ozempic (0.25 or 0.5 MG/DOSE) 2MG /3ML pen-injectors is required/requested.   Insurance verification completed.   The patient is insured through [facility] MEDICAID.   Per test claim: PA required; PA submitted to above mentioned insurance via Latent Key/confirmation #/EOC [id number] Status is pending

## 2023-12-07 NOTE — Telephone Encounter (Signed)
 Good Morning, please try prior auth for Ozempic for DM2. She is diet-controlled but Courtney Cruz was not covered for obesity.

## 2023-12-07 NOTE — Telephone Encounter (Signed)
 PA request has been Submitted. New Encounter has been or will be created for follow up. For additional info see Pharmacy Prior Auth telephone encounter from 12/07/23.

## 2023-12-07 NOTE — Telephone Encounter (Signed)
 Hey Dr. Manya, looks like her coverage for [employer] has termed, when I did an eligibility check for insurance it only pulls medicaid so I found the [employer] card under her media tab and ran a test claim, see below:  Coverage termed 12/06/23

## 2024-01-12 ENCOUNTER — Ambulatory Visit: Payer: Self-pay | Admitting: Physician Assistant

## 2024-01-15 ENCOUNTER — Ambulatory Visit (INDEPENDENT_AMBULATORY_CARE_PROVIDER_SITE_OTHER): Payer: Self-pay | Admitting: Physician Assistant

## 2024-01-15 ENCOUNTER — Encounter: Payer: Self-pay | Admitting: Physician Assistant

## 2024-01-15 VITALS — BP 132/80 | HR 93 | Temp 98.2°F | Ht 59.0 in | Wt 196.0 lb

## 2024-01-15 DIAGNOSIS — R030 Elevated blood-pressure reading, without diagnosis of hypertension: Secondary | ICD-10-CM | POA: Diagnosis not present

## 2024-01-15 DIAGNOSIS — E1169 Type 2 diabetes mellitus with other specified complication: Secondary | ICD-10-CM | POA: Diagnosis not present

## 2024-01-15 DIAGNOSIS — Z7985 Long-term (current) use of injectable non-insulin antidiabetic drugs: Secondary | ICD-10-CM | POA: Diagnosis not present

## 2024-01-15 MED ORDER — OZEMPIC (0.25 OR 0.5 MG/DOSE) 2 MG/3ML ~~LOC~~ SOPN
0.5000 mg | PEN_INJECTOR | SUBCUTANEOUS | 0 refills | Status: AC
Start: 2024-01-15 — End: 2024-02-12

## 2024-01-15 NOTE — Progress Notes (Signed)
 Date:  01/15/2024   Name:  Courtney Cruz   DOB:  [birth year]   MRN:  [id number]   Chief Complaint: Diabetes  Diabetes    [name] presents for 6 wk f/u on Ozempic having just recently taken her second dose of the 0.5 mg strength. Doing well with this, no issues, down 6 lb since last visit.   BP runs high during the day but is better at night and on weekends. 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 She does not have any dedicated exercise time as she is often exhausted at the end of the day.   Medication list has been reviewed and updated.  Current Meds  Medication Sig   diclofenac  (VOLTAREN ) 50 MG EC tablet TAKE 1 TABLET BY MOUTH 2 TIMES DAILY AS NEEDED.   diclofenac  Sodium (VOLTAREN ) 1 % GEL Apply 2 g topically 4 (four) times daily. Use on affected joint up to 4x/day as needed. 2 grams is roughly 4 fingertips' worth of gel.   ferrous sulfate 324 MG TBEC Take 324 mg by mouth daily with breakfast.   venlafaxine  XR (EFFEXOR -XR) 37.5 MG 24 hr capsule Take 1 capsule (37.5 mg total) by mouth daily with breakfast.   [DISCONTINUED] OZEMPIC, 0.25 OR 0.5 MG/DOSE, 2 MG/3ML SOPN Inject 0.25 mg into the skin once a week for 28 days, THEN 0.5 mg once a week for 14 days.     Review of Systems  Patient Active Problem List   Diagnosis Date Noted   Long-term current use of injectable noninsulin antidiabetic medication 12/07/2023   Class 3 severe obesity with serious comorbidity in adult [facility]) 10/26/2023   Chronic left shoulder pain 10/26/2023   Hot flashes due to menopause 10/26/2023   Elevated blood pressure reading without diagnosis of hypertension 10/26/2023   Seasonal allergies    Type 2 diabetes mellitus with other specified complication (HCC)    Tendinopathy of left biceps tendon 03/10/2023   Rotator cuff impingement syndrome of left shoulder 03/06/2023   Polyp of transverse colon    Migraine 05/23/2011     Allergies  Allergen Reactions   Sumatriptan Anaphylaxis and Shortness Of Breath   Latex Rash    Immunization History  Administered Date(s) Administered   Tdap 09/12/2012    Past Surgical History:  Procedure Laterality Date   ABDOMINAL HYSTERECTOMY     CESAREAN SECTION     COLONOSCOPY WITH PROPOFOL  N/A 09/09/2021   Procedure: COLONOSCOPY WITH PROPOFOL ;  Surgeon: Jinny Carmine, MD;  Location: [facility];  Service: Endoscopy;  Laterality: N/A;   HYSTERECTOMY ABDOMINAL WITH SALPINGECTOMY Bilateral 10/16/2019   Procedure: HYSTERECTOMY ABDOMINAL WITH SALPINGECTOMY;  Surgeon: Leonce Garnette BIRCH, MD;  Location: [facility];  Service: Gynecology;  Laterality: Bilateral;   LAPAROTOMY WITH STAGING N/A 10/16/2019   Procedure: LAPAROTOMY EXPLORATORY;  Surgeon: Leonce Garnette BIRCH, MD;  Location: [facility];  Service: Gynecology;  Laterality: N/A;    Social History   Tobacco Use   Smoking status: Former   Smokeless tobacco: Never  Vaping Use   Vaping status: Never Used  Substance Use Topics   Alcohol use: Yes    Comment: Occ   Drug use: Never    Family History  Problem Relation Age of Onset   Diabetes Sister    Stroke Maternal Grandfather    Diabetes Maternal Grandfather    Hypertension Maternal Grandfather    Breast cancer Paternal Grandmother    Cancer  Paternal Grandmother         01/15/2024    3:58 PM 12/06/2023    2:12 PM 10/26/2023    3:38 PM 03/24/2023    2:54 PM  GAD 7 : Generalized Anxiety Score  Nervous, Anxious, on Edge 0 0 0 0  Control/stop worrying 0 0 0 0  Worry too much - different things 0 0 0 0  Trouble relaxing 0 0 0 0  Restless 0 0 0 0  Easily annoyed or irritable 0 0 2 0  Afraid - awful might happen 0 0 0 0  Total GAD 7 Score 0 0 2 0  Anxiety Difficulty Not difficult at all Not difficult at all Not difficult at all Not difficult at all       01/15/2024    3:58 PM 12/06/2023    2:12 PM 10/26/2023    3:38 PM  Depression screen PHQ 2/9   Decreased Interest 0 0 0  Down, Depressed, Hopeless 0 0 0  PHQ - 2 Score 0 0 0  Altered sleeping   2  Tired, decreased energy   2  Change in appetite   0  Feeling bad or failure about yourself    0  Trouble concentrating   0  Moving slowly or fidgety/restless   0  Suicidal thoughts   0  PHQ-9 Score   4   Difficult doing work/chores   Not difficult at all     Data saved with a previous flowsheet row definition    BP Readings from Last 3 Encounters:  01/15/24 132/80  12/06/23 136/66  10/26/23 (!) 140/94    Wt Readings from Last 3 Encounters:  01/15/24 196 lb (88.9 kg)  12/06/23 202 lb (91.6 kg)  10/26/23 203 lb (92.1 kg)    BP 132/80 (Cuff Size: Large)   Pulse 93   Temp 98.2 F (36.8 C)   Ht 4' 11 (1.499 m)   Wt 196 lb (88.9 kg)   LMP 09/28/2019   SpO2 98%   BMI 39.59 kg/m   Physical Exam Vitals and nursing note reviewed.  Constitutional:      Appearance: Normal appearance.  Cardiovascular:     Rate and Rhythm: Normal rate.  Pulmonary:     Effort: Pulmonary effort is normal.  Abdominal:     General: There is no distension.  Musculoskeletal:        General: Normal range of motion.  Skin:    General: Skin is warm and dry.  Neurological:     Mental Status: She is alert and oriented to person, place, and time.     Gait: Gait is intact.  Psychiatric:        Mood and Affect: Mood and affect normal.     Recent Labs     Component Value Date/Time   NA 144 02/17/2023 1605   NA 137 05/28/2014 2307   K 4.3 02/17/2023 1605   K 3.7 05/28/2014 2307   CL 108 (H) 02/17/2023 1605   CL 107 05/28/2014 2307   CO2 23 02/17/2023 1605   CO2 25 05/28/2014 2307   GLUCOSE 90 02/17/2023 1605   GLUCOSE 135 (H) 10/18/2019 0618   GLUCOSE 121 (H) 05/28/2014 2307   BUN 15 02/17/2023 1605   BUN 10 05/28/2014 2307   CREATININE 0.82 02/17/2023 1605   CREATININE 0.80 05/28/2014 2307   CALCIUM 9.7 02/17/2023 1605   CALCIUM 8.7 (L) 05/28/2014 2307   PROT 7.7 08/11/2022 1442    PROT 7.4  12/14/2012 2033   ALBUMIN 4.5 02/17/2023 1605   ALBUMIN 3.1 (L) 12/14/2012 2033   AST 17 08/11/2022 1442   AST 23 12/14/2012 2033   ALT 12 08/11/2022 1442   ALT 16 12/14/2012 2033   ALKPHOS 98 08/11/2022 1442   ALKPHOS 74 12/14/2012 2033   BILITOT 0.2 08/11/2022 1442   BILITOT 0.2 12/14/2012 2033   GFRNONAA >60 10/18/2019 0618   GFRNONAA >60 05/28/2014 2307   GFRAA >60 10/18/2019 0618   GFRAA >60 05/28/2014 2307    Lab Results  Component Value Date   WBC 7.6 05/10/2022   HGB 13.3 05/10/2022   HCT 41.5 05/10/2022   MCV 82 05/10/2022   PLT 299 05/10/2022   Lab Results  Component Value Date   HGBA1C 6.3 (A) 10/26/2023   HGBA1C 6.4 (H) 02/17/2023   HGBA1C 6.4 (H) 08/11/2022   Lab Results  Component Value Date   CHOL 179 08/11/2022   HDL 70 08/11/2022   LDLCALC 92 08/11/2022   TRIG 96 08/11/2022   No results found for: TSH    Assessment and Plan:  Type 2 diabetes mellitus with other specified complication, without long-term current use of insulin (HCC) Assessment & Plan: Continue Ozempic 0.5 mg for at least the next 1 month. Will then repeat A1c and continue at this dose or consider dose increase. Emphasized the importance of routine physical activity and encouraged her to go on a 5-10 minute walk at the end of the day and/or consider exercising on weekends.   Orders: -     Ozempic (0.25 or 0.5 MG/DOSE); Inject 0.5 mg into the skin once a week for 28 days.  Dispense: 3 mL; Refill: 0  Elevated blood pressure reading without diagnosis of hypertension Assessment & Plan: Reviewed the Seven S's of hypertension including soil (encouraged plant-forward whole food eating habits), salt (intake <2g), smoking, stimulants (e.g. caffeine), stress, sleep (quantity, quality, timing, snoring/OSA), and sedentary lifestyle (aim for 150 active minutes per week).   Probably the largest room for improvement lies in stress (childcare) and sedentary lifestyle. 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       Return in about 4 weeks (around 02/12/2024) for OV f/u Ozempic.    Rolan Hoyle, PA-C, DMSc, Nutritionist [facility] Primary Care and Sports Medicine [facility] [phone]

## 2024-01-15 NOTE — Assessment & Plan Note (Signed)
 Reviewed the Seven S's of hypertension including soil (encouraged plant-forward whole food eating habits), salt (intake <2g), smoking, stimulants (e.g. caffeine), stress, sleep (quantity, quality, timing, snoring/OSA), and sedentary lifestyle (aim for 150 active minutes per week).   Probably the largest room for improvement lies in stress (childcare) and sedentary lifestyle. 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

## 2024-01-15 NOTE — Assessment & Plan Note (Signed)
 Continue Ozempic 0.5 mg for at least the next 1 month. Will then repeat A1c and continue at this dose or consider dose increase. Emphasized the importance of routine physical activity and encouraged her to go on a 5-10 minute walk at the end of the day and/or consider exercising on weekends.

## 2024-02-15 ENCOUNTER — Ambulatory Visit: Payer: Self-pay | Admitting: Physician Assistant

## 2024-02-21 ENCOUNTER — Encounter: Payer: Self-pay | Admitting: Physician Assistant

## 2024-02-21 ENCOUNTER — Ambulatory Visit (INDEPENDENT_AMBULATORY_CARE_PROVIDER_SITE_OTHER): Admitting: Physician Assistant

## 2024-02-21 VITALS — BP 130/90 | HR 78 | Temp 98.1°F | Ht 59.0 in | Wt 194.0 lb

## 2024-02-21 DIAGNOSIS — R030 Elevated blood-pressure reading, without diagnosis of hypertension: Secondary | ICD-10-CM | POA: Diagnosis not present

## 2024-02-21 DIAGNOSIS — E1169 Type 2 diabetes mellitus with other specified complication: Secondary | ICD-10-CM | POA: Diagnosis not present

## 2024-02-21 DIAGNOSIS — Z7985 Long-term (current) use of injectable non-insulin antidiabetic drugs: Secondary | ICD-10-CM

## 2024-02-21 LAB — POCT GLYCOSYLATED HEMOGLOBIN (HGB A1C): Hemoglobin A1C: 5.8 % — AB (ref 4.0–5.6)

## 2024-02-21 MED ORDER — OZEMPIC (1 MG/DOSE) 4 MG/3ML ~~LOC~~ SOPN: 1.0000 mg | PEN_INJECTOR | SUBCUTANEOUS | 1 refills | Status: AC

## 2024-02-21 NOTE — Assessment & Plan Note (Addendum)
 A1c 5.8% today reflecting excellent control. Increase Ozempic  to 1 mg weekly for at least the next 2 months.   Advised the importance of adequate protein intake on this medication as well as resistance training which will not only improve insulin sensitivity but also build muscle mass and reduce risk of atrophy.  I recommended minimum 20 min sessions 3x/wk. Advised importance of portion control and slower eating on this medication to minimize GI side effects. Avoid high fat foods as these may cause discomfort. Patient aware of potential for weight regain when eventually stopping the medication. For this reason, continued efforts toward improving lifestyle will be particularly important moving forward.

## 2024-02-21 NOTE — Progress Notes (Signed)
 "   Date:  02/21/2024   Name:  Courtney Cruz   DOB:  03-May-1970   MRN:  [id number]   Chief Complaint: Diabetes  HPI  [name] returns for 1 month follow-up on DM2 presently managed with Ozempic  0.5 mg, due for A1c today.  This dose has been well-tolerated. Weight is down an additional 2 lb since last time.   Last visit we also discussed her elevated blood pressure readings/borderline hypertension with the biggest opportunities for lifestyle improvement lying in stress reduction (childcare) and sedentary lifestyle. 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 She has still not incorporated any significant exercise.   Medication list has been reviewed and updated.  Active Medications[1]   Review of Systems  Patient Active Problem List   Diagnosis Date Noted   Long-term current use of injectable noninsulin antidiabetic medication 12/07/2023   Class 3 severe obesity with serious comorbidity in adult [facility]) 10/26/2023   Chronic left shoulder pain 10/26/2023   Hot flashes due to menopause 10/26/2023   Elevated blood pressure reading without diagnosis of hypertension 10/26/2023   Seasonal allergies    Type 2 diabetes mellitus with other specified complication (HCC)    Tendinopathy of left biceps tendon 03/10/2023   Rotator cuff impingement syndrome of left shoulder 03/06/2023   Polyp of transverse colon    Migraine 05/23/2011    Allergies[2]  Immunization History  Administered Date(s) Administered   PPD Test 11/27/2007   Tdap 09/12/2012    Past Surgical History:  Procedure Laterality Date   ABDOMINAL HYSTERECTOMY     CESAREAN SECTION     COLONOSCOPY WITH PROPOFOL  N/A 09/09/2021   Procedure: COLONOSCOPY WITH PROPOFOL ;  Surgeon: Jinny Carmine, MD;  Location[facility];  Service: Endoscopy;  Laterality: N/A;   HYSTERECTOMY ABDOMINAL WITH SALPINGECTOMY Bilateral 10/16/2019   Procedure: HYSTERECTOMY ABDOMINAL  WITH SALPINGECTOMY;  Surgeon: Leonce Garnette BIRCH, MD;  Location: [facility];  Service: Gynecology;  Laterality: Bilateral;   LAPAROTOMY WITH STAGING N/A 10/16/2019   Procedure: LAPAROTOMY EXPLORATORY;  Surgeon: Leonce Garnette BIRCH, MD;  Location: [facility];  Service: Gynecology;  Laterality: N/A;    Social History[3]  Family History  Problem Relation Age of Onset   Diabetes Sister    Stroke Maternal Grandfather    Diabetes Maternal Grandfather    Hypertension Maternal Grandfather    Breast cancer Paternal Grandmother    Cancer Paternal Grandmother         02/21/2024   10:11 AM 01/15/2024    3:58 PM 12/06/2023    2:12 PM 10/26/2023    3:38 PM  GAD 7 : Generalized Anxiety Score  Nervous, Anxious, on Edge 0 0 0 0  Control/stop worrying 0 0 0 0  Worry too much - different things 0 0 0 0  Trouble relaxing 0 0 0 0  Restless 0 0 0 0  Easily annoyed or irritable 0 0 0 2  Afraid - awful might happen 0 0 0 0  Total GAD 7 Score 0 0 0 2  Anxiety Difficulty Not difficult at all Not difficult at all Not difficult at all Not difficult at all       02/21/2024   10:10 AM 01/15/2024    3:58 PM 12/06/2023    2:12 PM  Depression screen PHQ 2/9  Decreased Interest 0 0 0  Down, Depressed, Hopeless 1 0 0  PHQ - 2 Score 1 0 0  Altered sleeping 0  Tired, decreased energy 0    Change in appetite 0    Feeling bad or failure about yourself  0    Trouble concentrating 0    Moving slowly or fidgety/restless 0    Suicidal thoughts 0    PHQ-9 Score 1    Difficult doing work/chores Not difficult at all      BP Readings from Last 3 Encounters:  02/21/24 (!) 130/90  01/15/24 132/80  12/06/23 136/66    Wt Readings from Last 3 Encounters:  02/21/24 194 lb (88 kg)  01/15/24 196 lb (88.9 kg)  12/06/23 202 lb (91.6 kg)    BP (!) 130/90 (Cuff Size: Large)   Pulse 78   Temp 98.1 F (36.7 C)   Ht 4' 11 (1.499 m)   Wt 194 lb (88 kg)   LMP 09/28/2019   SpO2 98%   BMI 39.18 kg/m    Physical Exam Vitals and nursing note reviewed.  Constitutional:      Appearance: Normal appearance. She is obese.  Cardiovascular:     Rate and Rhythm: Normal rate and regular rhythm.     Heart sounds: No murmur heard.    No friction rub. No gallop.  Pulmonary:     Effort: Pulmonary effort is normal.     Breath sounds: Normal breath sounds.  Abdominal:     General: There is no distension.  Musculoskeletal:        General: Normal range of motion.  Skin:    General: Skin is warm and dry.  Neurological:     Mental Status: She is alert and oriented to person, place, and time.     Gait: Gait is intact.  Psychiatric:        Mood and Affect: Mood and affect normal.     Recent Labs     Component Value Date/Time   NA 144 02/17/2023 1605   NA 137 05/28/2014 2307   K 4.3 02/17/2023 1605   K 3.7 05/28/2014 2307   CL 108 (H) 02/17/2023 1605   CL 107 05/28/2014 2307   CO2 23 02/17/2023 1605   CO2 25 05/28/2014 2307   GLUCOSE 90 02/17/2023 1605   GLUCOSE 135 (H) 10/18/2019 0618   GLUCOSE 121 (H) 05/28/2014 2307   BUN 15 02/17/2023 1605   BUN 10 05/28/2014 2307   CREATININE 0.82 02/17/2023 1605   CREATININE 0.80 05/28/2014 2307   CALCIUM 9.7 02/17/2023 1605   CALCIUM 8.7 (L) 05/28/2014 2307   PROT 7.7 08/11/2022 1442   PROT 7.4 12/14/2012 2033   ALBUMIN 4.5 02/17/2023 1605   ALBUMIN 3.1 (L) 12/14/2012 2033   AST 17 08/11/2022 1442   AST 23 12/14/2012 2033   ALT 12 08/11/2022 1442   ALT 16 12/14/2012 2033   ALKPHOS 98 08/11/2022 1442   ALKPHOS 74 12/14/2012 2033   BILITOT 0.2 08/11/2022 1442   BILITOT 0.2 12/14/2012 2033   GFRNONAA >60 10/18/2019 0618   GFRNONAA >60 05/28/2014 2307   GFRAA >60 10/18/2019 0618   GFRAA >60 05/28/2014 2307    Lab Results  Component Value Date   WBC 7.6 05/10/2022   HGB 13.3 05/10/2022   HCT 41.5 05/10/2022   MCV 82 05/10/2022   PLT 299 05/10/2022   Lab Results  Component Value Date   HGBA1C 5.8 (A) 02/21/2024   HGBA1C 6.3 (A)  10/26/2023   HGBA1C 6.4 (H) 02/17/2023   Lab Results  Component Value Date   CHOL 179 08/11/2022   HDL 70 08/11/2022  LDLCALC 92 08/11/2022   TRIG 96 08/11/2022   No results found for: TSH    Assessment and Plan:  Type 2 diabetes mellitus with other specified complication, without long-term current use of insulin (HCC) Assessment & Plan: A1c 5.8% today reflecting excellent control. Increase Ozempic  to 1 mg weekly for at least the next 2 months.   Advised the importance of adequate protein intake on this medication as well as resistance training which will not only improve insulin sensitivity but also build muscle mass and reduce risk of atrophy.  I recommended minimum 20 min sessions 3x/wk. Advised importance of portion control and slower eating on this medication to minimize GI side effects. Avoid high fat foods as these may cause discomfort. Patient aware of potential for weight regain when eventually stopping the medication. For this reason, continued efforts toward improving lifestyle will be particularly important moving forward.  Orders: -     POCT glycosylated hemoglobin (Hb A1C) -     Ozempic  (1 MG/DOSE); Inject 1 mg into the skin once a week.  Dispense: 3 mL; Refill: 1  Long-term current use of injectable noninsulin antidiabetic medication  Elevated blood pressure reading without diagnosis of hypertension Assessment & Plan: Reviewed the Seven S's of hypertension including soil (encouraged plant-forward whole food eating habits), salt (intake <2g), smoking, stimulants (e.g. caffeine), stress, sleep (quantity, quality, timing, snoring/OSA), and sedentary lifestyle (aim for 150 active minutes per week).   Probably the largest room for improvement lies in stress (childcare) and sedentary lifestyle. Will hold off on pharmacotherapy but encouraged home BP monitoring.       F/u 2 months OV vs CPE   Rolan Hoyle, PA-C, DMSc, DipACLM, Nutritionist [facility] [phone]      [1]  Current Meds  Medication Sig   diclofenac  (VOLTAREN ) 50 MG EC tablet TAKE 1 TABLET BY MOUTH 2 TIMES DAILY AS NEEDED.   diclofenac  Sodium (VOLTAREN ) 1 % GEL Apply 2 g topically 4 (four) times daily. Use on affected joint up to 4x/day as needed. 2 grams is roughly 4 fingertips' worth of gel.   ferrous sulfate 324 MG TBEC Take 324 mg by mouth daily with breakfast.   OZEMPIC , 1 MG/DOSE, 4 MG/3ML SOPN Inject 1 mg into the skin once a week.   venlafaxine  XR (EFFEXOR -XR) 37.5 MG 24 hr capsule Take 1 capsule (37.5 mg total) by mouth daily with breakfast.  [2]  Allergies Allergen Reactions   Sumatriptan Anaphylaxis and Shortness Of Breath   Latex Rash  [3]  Social History Tobacco Use   Smoking status: Former   Smokeless tobacco: Never  Vaping Use   Vaping status: Never Used  Substance Use Topics   Alcohol use: Yes    Comment: Occ   Drug use: Never   "

## 2024-02-21 NOTE — Assessment & Plan Note (Signed)
 Reviewed the Seven S's of hypertension including soil (encouraged plant-forward whole food eating habits), salt (intake <2g), smoking, stimulants (e.g. caffeine), stress, sleep (quantity, quality, timing, snoring/OSA), and sedentary lifestyle (aim for 150 active minutes per week).   Probably the largest room for improvement lies in stress (childcare) and sedentary lifestyle. Will hold off on pharmacotherapy but encouraged home BP monitoring.

## 2024-04-19 ENCOUNTER — Ambulatory Visit: Admitting: Physician Assistant
